# Patient Record
Sex: Female | Born: 1950
Health system: Southern US, Community
[De-identification: ages and names within clinical notes are randomized; demographics above are authoritative.]

---

## 1999-09-23 ENCOUNTER — Other Ambulatory Visit: Admission: RE | Admit: 1999-09-23 | Discharge: 1999-09-23 | Payer: Self-pay | Admitting: Gynecology

## 2000-01-06 ENCOUNTER — Encounter: Admission: RE | Admit: 2000-01-06 | Discharge: 2000-01-06 | Payer: Self-pay | Admitting: Gynecology

## 2000-01-06 ENCOUNTER — Encounter: Payer: Self-pay | Admitting: Gynecology

## 2000-09-17 ENCOUNTER — Other Ambulatory Visit: Admission: RE | Admit: 2000-09-17 | Discharge: 2000-09-17 | Payer: Self-pay | Admitting: Gynecology

## 2001-01-07 ENCOUNTER — Encounter: Admission: RE | Admit: 2001-01-07 | Discharge: 2001-01-07 | Payer: Self-pay

## 2001-01-07 ENCOUNTER — Encounter: Payer: Self-pay | Admitting: Gynecology

## 2001-09-27 ENCOUNTER — Encounter: Admission: RE | Admit: 2001-09-27 | Discharge: 2001-09-27 | Payer: Self-pay | Admitting: Gynecology

## 2001-09-27 ENCOUNTER — Encounter: Payer: Self-pay | Admitting: Gynecology

## 2001-09-30 ENCOUNTER — Other Ambulatory Visit: Admission: RE | Admit: 2001-09-30 | Discharge: 2001-09-30 | Payer: Self-pay | Admitting: Gynecology

## 2002-10-24 ENCOUNTER — Other Ambulatory Visit: Admission: RE | Admit: 2002-10-24 | Discharge: 2002-10-24 | Payer: Self-pay | Admitting: Gynecology

## 2003-11-09 ENCOUNTER — Encounter: Admission: RE | Admit: 2003-11-09 | Discharge: 2003-11-09 | Payer: Self-pay | Admitting: Gynecology

## 2003-11-09 ENCOUNTER — Other Ambulatory Visit: Admission: RE | Admit: 2003-11-09 | Discharge: 2003-11-09 | Payer: Self-pay | Admitting: Gynecology

## 2005-01-13 ENCOUNTER — Other Ambulatory Visit: Admission: RE | Admit: 2005-01-13 | Discharge: 2005-01-13 | Payer: Self-pay | Admitting: Gynecology

## 2005-11-10 ENCOUNTER — Encounter: Admission: RE | Admit: 2005-11-10 | Discharge: 2005-11-10 | Payer: Self-pay | Admitting: Gynecology

## 2005-11-22 ENCOUNTER — Encounter: Admission: RE | Admit: 2005-11-22 | Discharge: 2005-11-22 | Payer: Self-pay | Admitting: Gynecology

## 2005-11-24 ENCOUNTER — Encounter (INDEPENDENT_AMBULATORY_CARE_PROVIDER_SITE_OTHER): Payer: Self-pay | Admitting: Diagnostic Radiology

## 2005-11-24 ENCOUNTER — Encounter: Admission: RE | Admit: 2005-11-24 | Discharge: 2005-11-24 | Payer: Self-pay | Admitting: Gynecology

## 2005-11-24 ENCOUNTER — Encounter (INDEPENDENT_AMBULATORY_CARE_PROVIDER_SITE_OTHER): Payer: Self-pay | Admitting: Specialist

## 2005-11-24 ENCOUNTER — Encounter (INDEPENDENT_AMBULATORY_CARE_PROVIDER_SITE_OTHER): Payer: Self-pay | Admitting: *Deleted

## 2005-11-27 ENCOUNTER — Encounter: Admission: RE | Admit: 2005-11-27 | Discharge: 2005-11-27 | Payer: Self-pay | Admitting: Gynecology

## 2005-12-08 ENCOUNTER — Encounter: Admission: RE | Admit: 2005-12-08 | Discharge: 2005-12-08 | Payer: Self-pay | Admitting: Gynecology

## 2006-01-08 ENCOUNTER — Ambulatory Visit (HOSPITAL_BASED_OUTPATIENT_CLINIC_OR_DEPARTMENT_OTHER): Admission: RE | Admit: 2006-01-08 | Discharge: 2006-01-08 | Payer: Self-pay | Admitting: General Surgery

## 2006-01-08 ENCOUNTER — Encounter: Admission: RE | Admit: 2006-01-08 | Discharge: 2006-01-08 | Payer: Self-pay | Admitting: General Surgery

## 2006-01-08 ENCOUNTER — Encounter (INDEPENDENT_AMBULATORY_CARE_PROVIDER_SITE_OTHER): Payer: Self-pay | Admitting: *Deleted

## 2006-01-08 ENCOUNTER — Encounter (INDEPENDENT_AMBULATORY_CARE_PROVIDER_SITE_OTHER): Payer: Self-pay | Admitting: General Surgery

## 2006-01-22 ENCOUNTER — Ambulatory Visit: Payer: Self-pay | Admitting: Oncology

## 2006-01-31 LAB — COMPREHENSIVE METABOLIC PANEL
AST: 35 U/L (ref 0–37)
Albumin: 4.2 g/dL (ref 3.5–5.2)
Alkaline Phosphatase: 256 U/L — ABNORMAL HIGH (ref 39–117)
BUN: 12 mg/dL (ref 6–23)
Creatinine, Ser: 0.86 mg/dL (ref 0.40–1.20)
Glucose, Bld: 95 mg/dL (ref 70–99)
Potassium: 4.1 mEq/L (ref 3.5–5.3)
Total Bilirubin: 0.6 mg/dL (ref 0.3–1.2)

## 2006-01-31 LAB — CBC WITH DIFFERENTIAL/PLATELET
EOS%: 4.3 % (ref 0.0–7.0)
HCT: 33.9 % — ABNORMAL LOW (ref 34.8–46.6)
HGB: 11.3 g/dL — ABNORMAL LOW (ref 11.6–15.9)
MCH: 32.2 pg (ref 26.0–34.0)
MCV: 96.6 fL (ref 81.0–101.0)
MONO#: 0.5 10*3/uL (ref 0.1–0.9)
NEUT%: 56.9 % (ref 39.6–76.8)
RBC: 3.51 10*6/uL — ABNORMAL LOW (ref 3.70–5.32)
RDW: 13.4 % (ref 11.3–14.5)
lymph#: 1.4 10*3/uL (ref 0.9–3.3)

## 2006-01-31 LAB — LACTATE DEHYDROGENASE: LDH: 355 U/L — ABNORMAL HIGH (ref 94–250)

## 2006-02-09 ENCOUNTER — Other Ambulatory Visit: Admission: RE | Admit: 2006-02-09 | Discharge: 2006-02-09 | Payer: Self-pay | Admitting: Gynecology

## 2006-02-09 ENCOUNTER — Encounter: Admission: RE | Admit: 2006-02-09 | Discharge: 2006-02-09 | Payer: Self-pay | Admitting: General Surgery

## 2006-02-12 ENCOUNTER — Ambulatory Visit (HOSPITAL_BASED_OUTPATIENT_CLINIC_OR_DEPARTMENT_OTHER): Admission: RE | Admit: 2006-02-12 | Discharge: 2006-02-12 | Payer: Self-pay | Admitting: General Surgery

## 2006-02-12 ENCOUNTER — Encounter (INDEPENDENT_AMBULATORY_CARE_PROVIDER_SITE_OTHER): Payer: Self-pay | Admitting: *Deleted

## 2006-02-19 ENCOUNTER — Ambulatory Visit: Admission: RE | Admit: 2006-02-19 | Discharge: 2006-05-06 | Payer: Self-pay | Admitting: *Deleted

## 2006-02-20 LAB — CBC WITH DIFFERENTIAL/PLATELET
BASO%: 0.8 % (ref 0.0–2.0)
Eosinophils Absolute: 0.2 10*3/uL (ref 0.0–0.5)
HCT: 35.9 % (ref 34.8–46.6)
HGB: 12.1 g/dL (ref 11.6–15.9)
LYMPH%: 32.3 % (ref 14.0–48.0)
MCHC: 33.9 g/dL (ref 32.0–36.0)
MONO#: 0.6 10*3/uL (ref 0.1–0.9)
NEUT#: 2.7 10*3/uL (ref 1.5–6.5)
NEUT%: 51.4 % (ref 39.6–76.8)
Platelets: 207 10*3/uL (ref 145–400)
WBC: 5.3 10*3/uL (ref 3.9–10.0)
lymph#: 1.7 10*3/uL (ref 0.9–3.3)

## 2006-02-20 LAB — COMPREHENSIVE METABOLIC PANEL
ALT: 25 U/L (ref 0–40)
CO2: 30 mEq/L (ref 19–32)
Calcium: 9.6 mg/dL (ref 8.4–10.5)
Chloride: 102 mEq/L (ref 96–112)
Creatinine, Ser: 0.8 mg/dL (ref 0.40–1.20)
Glucose, Bld: 82 mg/dL (ref 70–99)
Total Protein: 7.6 g/dL (ref 6.0–8.3)

## 2006-02-20 LAB — CANCER ANTIGEN 27.29: CA 27.29: 43 U/mL — ABNORMAL HIGH (ref 0–39)

## 2006-02-20 LAB — LACTATE DEHYDROGENASE: LDH: 177 U/L (ref 94–250)

## 2006-05-14 ENCOUNTER — Ambulatory Visit: Payer: Self-pay | Admitting: Oncology

## 2006-05-25 LAB — CBC WITH DIFFERENTIAL/PLATELET
BASO%: 0.3 % (ref 0.0–2.0)
EOS%: 3.4 % (ref 0.0–7.0)
HCT: 36.8 % (ref 34.8–46.6)
HGB: 12.5 g/dL (ref 11.6–15.9)
MCH: 31.5 pg (ref 26.0–34.0)
MONO#: 0.5 10*3/uL (ref 0.1–0.9)
Platelets: 167 10*3/uL (ref 145–400)
RBC: 3.99 10*6/uL (ref 3.70–5.32)
WBC: 2.8 10*3/uL — ABNORMAL LOW (ref 3.9–10.0)

## 2006-05-25 LAB — COMPREHENSIVE METABOLIC PANEL
ALT: 51 U/L — ABNORMAL HIGH (ref 0–35)
AST: 47 U/L — ABNORMAL HIGH (ref 0–37)
Alkaline Phosphatase: 124 U/L — ABNORMAL HIGH (ref 39–117)
Calcium: 9.3 mg/dL (ref 8.4–10.5)
Chloride: 103 mEq/L (ref 96–112)
Creatinine, Ser: 0.77 mg/dL (ref 0.40–1.20)
Potassium: 4 mEq/L (ref 3.5–5.3)
Total Bilirubin: 0.7 mg/dL (ref 0.3–1.2)
Total Protein: 7.1 g/dL (ref 6.0–8.3)

## 2006-05-25 LAB — CANCER ANTIGEN 27.29: CA 27.29: 36 U/mL (ref 0–39)

## 2006-06-26 ENCOUNTER — Ambulatory Visit: Payer: Self-pay | Admitting: Oncology

## 2006-10-04 ENCOUNTER — Ambulatory Visit: Payer: Self-pay | Admitting: Oncology

## 2006-10-04 LAB — CBC WITH DIFFERENTIAL/PLATELET
BASO%: 0.3 % (ref 0.0–2.0)
Basophils Absolute: 0 10*3/uL (ref 0.0–0.1)
EOS%: 3 % (ref 0.0–7.0)
HGB: 12.5 g/dL (ref 11.6–15.9)
MCH: 32.6 pg (ref 26.0–34.0)
RBC: 3.83 10*6/uL (ref 3.70–5.32)
RDW: 12.9 % (ref 11.3–14.5)
lymph#: 1.4 10*3/uL (ref 0.9–3.3)

## 2006-10-05 LAB — COMPREHENSIVE METABOLIC PANEL
ALT: 20 U/L (ref 0–35)
AST: 23 U/L (ref 0–37)
Albumin: 4.2 g/dL (ref 3.5–5.2)
Alkaline Phosphatase: 71 U/L (ref 39–117)
Glucose, Bld: 113 mg/dL — ABNORMAL HIGH (ref 70–99)
Potassium: 3.9 mEq/L (ref 3.5–5.3)
Sodium: 139 mEq/L (ref 135–145)
Total Protein: 6.9 g/dL (ref 6.0–8.3)

## 2006-10-05 LAB — CANCER ANTIGEN 27.29: CA 27.29: 29 U/mL (ref 0–39)

## 2006-12-18 ENCOUNTER — Ambulatory Visit: Payer: Self-pay | Admitting: Oncology

## 2006-12-21 ENCOUNTER — Encounter: Admission: RE | Admit: 2006-12-21 | Discharge: 2006-12-21 | Payer: Self-pay | Admitting: General Surgery

## 2006-12-21 LAB — CBC WITH DIFFERENTIAL/PLATELET
Basophils Absolute: 0 10*3/uL (ref 0.0–0.1)
Eosinophils Absolute: 0.3 10*3/uL (ref 0.0–0.5)
HGB: 12.7 g/dL (ref 11.6–15.9)
LYMPH%: 29.5 % (ref 14.0–48.0)
MCV: 95.1 fL (ref 81.0–101.0)
MONO#: 0.3 10*3/uL (ref 0.1–0.9)
MONO%: 7.2 % (ref 0.0–13.0)
NEUT#: 2.7 10*3/uL (ref 1.5–6.5)
Platelets: 157 10*3/uL (ref 145–400)

## 2006-12-21 LAB — COMPREHENSIVE METABOLIC PANEL
ALT: 23 U/L (ref 0–35)
Alkaline Phosphatase: 73 U/L (ref 39–117)
BUN: 12 mg/dL (ref 6–23)
CO2: 27 mEq/L (ref 19–32)
Calcium: 9.7 mg/dL (ref 8.4–10.5)
Glucose, Bld: 106 mg/dL — ABNORMAL HIGH (ref 70–99)
Potassium: 3.9 mEq/L (ref 3.5–5.3)
Total Bilirubin: 0.6 mg/dL (ref 0.3–1.2)
Total Protein: 7.1 g/dL (ref 6.0–8.3)

## 2007-06-18 ENCOUNTER — Ambulatory Visit: Payer: Self-pay | Admitting: Oncology

## 2007-06-20 LAB — CBC WITH DIFFERENTIAL/PLATELET
Basophils Absolute: 0 10*3/uL (ref 0.0–0.1)
EOS%: 4.2 % (ref 0.0–7.0)
HCT: 36.1 % (ref 34.8–46.6)
HGB: 12.4 g/dL (ref 11.6–15.9)
MCH: 32.3 pg (ref 26.0–34.0)
MCV: 94.1 fL (ref 81.0–101.0)
MONO%: 10.5 % (ref 0.0–13.0)
NEUT%: 40 % (ref 39.6–76.8)

## 2007-06-20 LAB — COMPREHENSIVE METABOLIC PANEL
AST: 22 U/L (ref 0–37)
Alkaline Phosphatase: 80 U/L (ref 39–117)
BUN: 12 mg/dL (ref 6–23)
Creatinine, Ser: 0.86 mg/dL (ref 0.40–1.20)
Total Bilirubin: 0.4 mg/dL (ref 0.3–1.2)

## 2007-06-20 LAB — CANCER ANTIGEN 27.29: CA 27.29: 35 U/mL (ref 0–39)

## 2007-07-19 ENCOUNTER — Encounter (INDEPENDENT_AMBULATORY_CARE_PROVIDER_SITE_OTHER): Payer: Self-pay | Admitting: Gynecology

## 2007-07-19 ENCOUNTER — Ambulatory Visit (HOSPITAL_BASED_OUTPATIENT_CLINIC_OR_DEPARTMENT_OTHER): Admission: RE | Admit: 2007-07-19 | Discharge: 2007-07-19 | Payer: Self-pay | Admitting: Gynecology

## 2008-01-03 ENCOUNTER — Encounter: Admission: RE | Admit: 2008-01-03 | Discharge: 2008-01-03 | Payer: Self-pay | Admitting: Gynecology

## 2008-04-09 ENCOUNTER — Ambulatory Visit: Payer: Self-pay | Admitting: Oncology

## 2009-02-05 ENCOUNTER — Encounter: Admission: RE | Admit: 2009-02-05 | Discharge: 2009-02-05 | Payer: Self-pay | Admitting: Gynecology

## 2010-02-11 ENCOUNTER — Encounter: Admission: RE | Admit: 2010-02-11 | Discharge: 2010-02-11 | Payer: Self-pay | Admitting: Gynecology

## 2010-06-19 ENCOUNTER — Encounter: Payer: Self-pay | Admitting: Gynecology

## 2010-10-11 NOTE — Op Note (Signed)
Heidi Barnes, Heidi Barnes               ACCOUNT NO.:  [id number]   MEDICAL RECORD NO.:  [id number]          PATIENT TYPE:  AMB   LOCATION:  [facility]                         FACILITY:  [facility]   PHYSICIAN:  Gretta Cool, M.D. DATE OF BIRTH:  04/21/1951   DATE OF PROCEDURE:  07/19/2007  DATE OF DISCHARGE:                               OPERATIVE REPORT   PREOPERATIVE DIAGNOSIS:  Large endometrial polyp on Tamoxifen therapy.   POSTOPERATIVE DIAGNOSIS:  Large endometrial polyp on Tamoxifen therapy.   PROCEDURE PERFORMED:  Hysteroscopy, resection of large endometrial  polyp, total endometrial ablation by resection plus VaporTrode.   SURGEON:  Gretta Cool, M.D.   ANESTHESIA:  IV sedation, paracervical block.   DESCRIPTION OF PROCEDURE:  Under excellent IV anesthesia as above, with  the patient prepped and draped in the lithotomy position in [place]  stirrups, the cervix was grasped with a single tooth tenaculum and  pulled down into view.  After paracervical, the cervix was progressively  dilated with a series of Pratt dilators to accommodate the 7 mm  resectoscope.  The resectoscope was used to photograph the cavity and  then to resect the large polyp identified.  The polyp was attached to  the posterior wall and was progressively resected.  The entire  endometrial cavity was also resected extending 5 mm or more out into the  myometrium until no further planned openings could be identified.  At  this point, the entire cavity was treated with VaporTrode so as to  eliminate any islands of endometrial tissue viable in the superficial  myometrium.  At this point, with reduced pressure, there was no  significant bleeding.  Fluid deficit 80 mL.  No complications.           ______________________________  Gretta Cool, M.D.     CWL/MEDQ  D:  07/19/2007  T:  07/19/2007  Job:  [id number]   cc:   Valentino Hue. Magrinat, M.D.  Fax: [phone]   Dr. Belva Crome

## 2010-10-14 ENCOUNTER — Other Ambulatory Visit: Payer: Self-pay | Admitting: Gynecology

## 2010-10-14 NOTE — Op Note (Signed)
Heidi Barnes, Heidi Barnes               ACCOUNT NO.:  [id number]   MEDICAL RECORD NO.:  [id number]          PATIENT TYPE:  AMB   LOCATION:  [facility]                          FACILITY:  [facility]   PHYSICIAN:  Rose Phi. Maple Hudson, M.D.   DATE OF BIRTH:  08/12/1950   DATE OF PROCEDURE:  01/08/2006  DATE OF DISCHARGE:                                 OPERATIVE REPORT   PREOPERATIVE DIAGNOSES:  1. Ductal carcinoma in situ of the right breast.  2. Atypical lobular hyperplasia of the left breast.   POSTOPERATIVE DIAGNOSES:  1. Ductal carcinoma in situ of the right breast.  2. Atypical lobular hyperplasia of the left breast.   OPERATION:  1. Right partial mastectomy with needle localization and specimen      mammogram.  2. Left breast biopsy with needle localization and specimen mammogram.   SURGEON:  Rose Phi. Maple Hudson, M.D.   ANESTHESIA:  General.   OPERATIVE PROCEDURE:  Prior to coming to the operating room two localizing  wires had been placed in the area of microcalcifications of the right breast  and a single wire in the left breast where the [facility] had been identified.   After suitable general anesthesia was induced, the patient was placed in the  supine position with arms extended on the arm board and both breasts prepped  and draped in the usual fashion.  The two wires were basically in the upper  outer quadrant of the right breast and I outlined a transverse incision.  The incision was made.  The wires exposed and then a wide excision of wire  and surrounding tissue was carried out.  Hemostasis obtained with cautery.  Specimen oriented for the pathologist.  Specimen mammogram confirmed the  removal of what was thought to be all the microcalcifications.  That  incision was closed, after infiltrating it with 0.25% Marcaine, with a  single layer of subcuticular 4-0 Monocryl and Steri-Strips.   While that was being done, a similar incision was made on the left side,  also in the upper outer quadrant,  and the wire and surrounding tissue were  excised.  Hemostasis obtained with cautery.  Specimen mammogram confirmed  the removal of the clip from the previous biopsy.  Single layer of the  subcuticular 4-0 Monocryl suture was used to close the skin and Steri-Strips  were applied.   Dressings were placed and then the patient was wrapped with an Ace bandage  and then allowed to go home.      Rose Phi. Maple Hudson, M.D.  Electronically Signed     PRY/MEDQ  D:  01/08/2006  T:  01/09/2006  Job:  [id number]

## 2010-10-14 NOTE — Op Note (Signed)
NAMESACHE, SANE               ACCOUNT NO.:  [id number]   MEDICAL RECORD NO.:  [id number]          PATIENT TYPE:  AMB   LOCATION:  [facility]                          FACILITY:  [facility]   PHYSICIAN:  Rose Phi. Maple Hudson, M.D.   DATE OF BIRTH:  [birth year]   DATE OF PROCEDURE:  02/12/2006  DATE OF DISCHARGE:                                 OPERATIVE REPORT   PREOPERATIVE DIAGNOSIS:  Stage I carcinoma of the right breast.   POSTOPERATIVE DIAGNOSIS:  Stage I carcinoma of the right breast.   OPERATIONS:  1. Blue dye injection.  2. Right sentinel lymph node biopsy.  3. Evacuation of breast hematoma.   SURGEON:  Dr. Francina Ames.   ANESTHESIA:  General.   OPERATIVE PROCEDURE:  This patient had originally presented with ductal  carcinoma in situ of the right breast which was excised.  A small, less than  5-mm focus of invasive cancer was discovered in this area of DCIS and so we  are returning to do a sentinel node biopsy.  In addition, she developed a  hematoma of the breast which needs to be evacuated.  Prior to coming to the  operating room, 1 millicurie of technetium sulfur colloid was injected  intradermally in the right breast.   After suitable general anesthesia was induced, the patient was placed in the  supine position with the arms extended on the arm board.  Then 5 cc of a  mixture of 2 cc of methylene blue and 3 cc of injectable saline was injected  in the subareolar tissue and the breast gently massaged for 3 minutes.  We  then prepped and draped the breast and axilla.  Careful scanning of the  right axilla with the NeoProbe revealed a hot spot.  A short transverse  axillary incision was made with dissection through the subcutaneous tissue.  The clavipectoral fascia was divided and deep to it was a blue and hot lymph  node.  This was removed as a sentinel node and actually it was 2 lymph  nodes.  There were no other blue, hot or palpable areas.   While that was being looked at by  the pathologist, I injected the incision  with a 0.25% Marcaine and then closed it with 3-0 Vicryl and subcuticular 4-  0 Monocryl and Steri-Strips.   The previous lumpectomy had been done in the upper portion of the breast.  The lateral margin of incision I incised and entered the hematoma cavity and  evacuated just a moderate-sized hematoma.  Because of the distortion, there  was a lot of blood dissected through the tissues which, of course, would not  come out, but I did evacuate the hematoma and then flushed out, with saline,  the cavity.  I then closed that with a subcuticular Monocryl suture and  Steri-Strips.   The sentinel node was reported as negative for metastatic disease.   Dressings were applied, and the patient transferred to the recovery room in  stable condition having tolerated the procedure well.      Rose Phi. Maple Hudson, M.D.  Electronically Signed  PRY/MEDQ  D:  02/12/2006  T:  02/12/2006  Job:  [id number]

## 2011-01-10 ENCOUNTER — Other Ambulatory Visit: Payer: Self-pay | Admitting: Gynecology

## 2011-01-10 DIAGNOSIS — Z853 Personal history of malignant neoplasm of breast: Secondary | ICD-10-CM

## 2011-01-10 DIAGNOSIS — Z9889 Other specified postprocedural states: Secondary | ICD-10-CM

## 2011-02-17 LAB — POCT I-STAT 4, (NA,K, GLUC, HGB,HCT)
HCT: 42
Operator id: 268271

## 2011-02-24 ENCOUNTER — Ambulatory Visit
Admission: RE | Admit: 2011-02-24 | Discharge: 2011-02-24 | Disposition: A | Discharge status: Home / Self Care | Payer: BC Managed Care – PPO | Source: Ambulatory Visit | Attending: Gynecology | Admitting: Gynecology

## 2011-02-24 DIAGNOSIS — Z853 Personal history of malignant neoplasm of breast: Secondary | ICD-10-CM

## 2011-02-24 DIAGNOSIS — Z9889 Other specified postprocedural states: Secondary | ICD-10-CM

## 2012-02-02 ENCOUNTER — Other Ambulatory Visit: Payer: Self-pay | Admitting: Gynecology

## 2012-03-11 ENCOUNTER — Other Ambulatory Visit: Payer: Self-pay | Admitting: Gynecology

## 2012-03-11 DIAGNOSIS — Z9882 Breast implant status: Secondary | ICD-10-CM

## 2012-03-11 DIAGNOSIS — Z853 Personal history of malignant neoplasm of breast: Secondary | ICD-10-CM

## 2012-03-22 ENCOUNTER — Ambulatory Visit
Admission: RE | Admit: 2012-03-22 | Discharge: 2012-03-22 | Disposition: A | Discharge status: Home / Self Care | Payer: BC Managed Care – PPO | Source: Ambulatory Visit | Attending: Gynecology | Admitting: Gynecology

## 2012-03-22 DIAGNOSIS — Z9882 Breast implant status: Secondary | ICD-10-CM

## 2012-03-22 DIAGNOSIS — Z853 Personal history of malignant neoplasm of breast: Secondary | ICD-10-CM

## 2013-03-17 ENCOUNTER — Other Ambulatory Visit: Payer: Self-pay | Admitting: Family Medicine

## 2013-03-17 DIAGNOSIS — Z9882 Breast implant status: Secondary | ICD-10-CM

## 2013-03-17 DIAGNOSIS — Z853 Personal history of malignant neoplasm of breast: Secondary | ICD-10-CM

## 2013-03-28 ENCOUNTER — Ambulatory Visit
Admission: RE | Admit: 2013-03-28 | Discharge: 2013-03-28 | Disposition: A | Discharge status: Home / Self Care | Payer: BC Managed Care – PPO | Source: Ambulatory Visit | Attending: Family Medicine | Admitting: Family Medicine

## 2013-03-28 DIAGNOSIS — Z853 Personal history of malignant neoplasm of breast: Secondary | ICD-10-CM

## 2013-03-28 DIAGNOSIS — Z9882 Breast implant status: Secondary | ICD-10-CM

## 2014-03-19 ENCOUNTER — Other Ambulatory Visit: Payer: Self-pay

## 2014-03-19 DIAGNOSIS — Z1231 Encounter for screening mammogram for malignant neoplasm of breast: Secondary | ICD-10-CM

## 2014-04-03 ENCOUNTER — Ambulatory Visit
Admission: RE | Admit: 2014-04-03 | Discharge: 2014-04-03 | Disposition: A | Discharge status: Home / Self Care | Payer: Medicare Other | Source: Ambulatory Visit

## 2014-04-03 ENCOUNTER — Other Ambulatory Visit: Payer: Self-pay

## 2014-04-03 DIAGNOSIS — Z1231 Encounter for screening mammogram for malignant neoplasm of breast: Secondary | ICD-10-CM

## 2014-10-23 DIAGNOSIS — Z79899 Other long term (current) drug therapy: Secondary | ICD-10-CM | POA: Diagnosis not present

## 2014-10-23 DIAGNOSIS — Z0000 Encounter for general adult medical examination without abnormal findings: Secondary | ICD-10-CM | POA: Diagnosis not present

## 2014-10-23 DIAGNOSIS — I10 Essential (primary) hypertension: Secondary | ICD-10-CM | POA: Diagnosis not present

## 2014-10-23 DIAGNOSIS — Z01411 Encounter for gynecological examination (general) (routine) with abnormal findings: Secondary | ICD-10-CM | POA: Diagnosis not present

## 2014-10-23 DIAGNOSIS — Z79891 Long term (current) use of opiate analgesic: Secondary | ICD-10-CM | POA: Diagnosis not present

## 2014-10-23 DIAGNOSIS — M818 Other osteoporosis without current pathological fracture: Secondary | ICD-10-CM | POA: Diagnosis not present

## 2014-10-23 DIAGNOSIS — Z01419 Encounter for gynecological examination (general) (routine) without abnormal findings: Secondary | ICD-10-CM | POA: Diagnosis not present

## 2014-10-23 DIAGNOSIS — E039 Hypothyroidism, unspecified: Secondary | ICD-10-CM | POA: Diagnosis not present

## 2014-10-23 DIAGNOSIS — Z23 Encounter for immunization: Secondary | ICD-10-CM | POA: Diagnosis not present

## 2014-10-28 DIAGNOSIS — M818 Other osteoporosis without current pathological fracture: Secondary | ICD-10-CM | POA: Diagnosis not present

## 2014-10-28 DIAGNOSIS — M810 Age-related osteoporosis without current pathological fracture: Secondary | ICD-10-CM | POA: Diagnosis not present

## 2015-04-07 ENCOUNTER — Other Ambulatory Visit: Payer: Self-pay

## 2015-04-07 DIAGNOSIS — Z1231 Encounter for screening mammogram for malignant neoplasm of breast: Secondary | ICD-10-CM

## 2015-04-13 DIAGNOSIS — Z23 Encounter for immunization: Secondary | ICD-10-CM | POA: Diagnosis not present

## 2015-05-14 ENCOUNTER — Ambulatory Visit
Admission: RE | Admit: 2015-05-14 | Discharge: 2015-05-14 | Disposition: A | Discharge status: Home / Self Care | Payer: Medicare Other | Source: Ambulatory Visit

## 2015-05-14 DIAGNOSIS — Z1231 Encounter for screening mammogram for malignant neoplasm of breast: Secondary | ICD-10-CM

## 2015-06-07 DIAGNOSIS — I10 Essential (primary) hypertension: Secondary | ICD-10-CM | POA: Diagnosis not present

## 2015-06-07 DIAGNOSIS — Z79899 Other long term (current) drug therapy: Secondary | ICD-10-CM | POA: Diagnosis not present

## 2015-06-07 DIAGNOSIS — E039 Hypothyroidism, unspecified: Secondary | ICD-10-CM | POA: Diagnosis not present

## 2015-06-07 DIAGNOSIS — M818 Other osteoporosis without current pathological fracture: Secondary | ICD-10-CM | POA: Diagnosis not present

## 2015-06-24 DIAGNOSIS — D351 Benign neoplasm of parathyroid gland: Secondary | ICD-10-CM | POA: Diagnosis not present

## 2015-06-24 DIAGNOSIS — R947 Abnormal results of other endocrine function studies: Secondary | ICD-10-CM | POA: Diagnosis not present

## 2015-12-06 DIAGNOSIS — I10 Essential (primary) hypertension: Secondary | ICD-10-CM | POA: Diagnosis not present

## 2015-12-06 DIAGNOSIS — E039 Hypothyroidism, unspecified: Secondary | ICD-10-CM | POA: Diagnosis not present

## 2015-12-10 ENCOUNTER — Encounter: Payer: Self-pay | Admitting: Genetic Counselor

## 2016-01-25 ENCOUNTER — Other Ambulatory Visit: Payer: Self-pay

## 2016-03-28 DIAGNOSIS — Z961 Presence of intraocular lens: Secondary | ICD-10-CM | POA: Diagnosis not present

## 2016-03-28 DIAGNOSIS — Z947 Corneal transplant status: Secondary | ICD-10-CM | POA: Diagnosis not present

## 2016-04-06 DIAGNOSIS — Z6826 Body mass index (BMI) 26.0-26.9, adult: Secondary | ICD-10-CM | POA: Diagnosis not present

## 2016-04-06 DIAGNOSIS — Z0000 Encounter for general adult medical examination without abnormal findings: Secondary | ICD-10-CM | POA: Diagnosis not present

## 2016-04-06 DIAGNOSIS — M818 Other osteoporosis without current pathological fracture: Secondary | ICD-10-CM | POA: Diagnosis not present

## 2016-04-06 DIAGNOSIS — E039 Hypothyroidism, unspecified: Secondary | ICD-10-CM | POA: Diagnosis not present

## 2016-04-06 DIAGNOSIS — Z23 Encounter for immunization: Secondary | ICD-10-CM | POA: Diagnosis not present

## 2016-04-06 DIAGNOSIS — I10 Essential (primary) hypertension: Secondary | ICD-10-CM | POA: Diagnosis not present

## 2016-04-06 DIAGNOSIS — Z79899 Other long term (current) drug therapy: Secondary | ICD-10-CM | POA: Diagnosis not present

## 2016-05-10 IMAGING — MG MM SCREENING BREAST W/IMPLANT TOMO BILATERAL
9 of 13 series · 9 of 29 positions shown · non-contrast
Comparison: Previous exam(s).

CLINICAL DATA: Screening.

EXAM:
DIGITAL SCREENING BILATERAL MAMMOGRAM WITH IMPLANTS, 3D TOMO WITH
CAD

[L CC (1 of 2)]
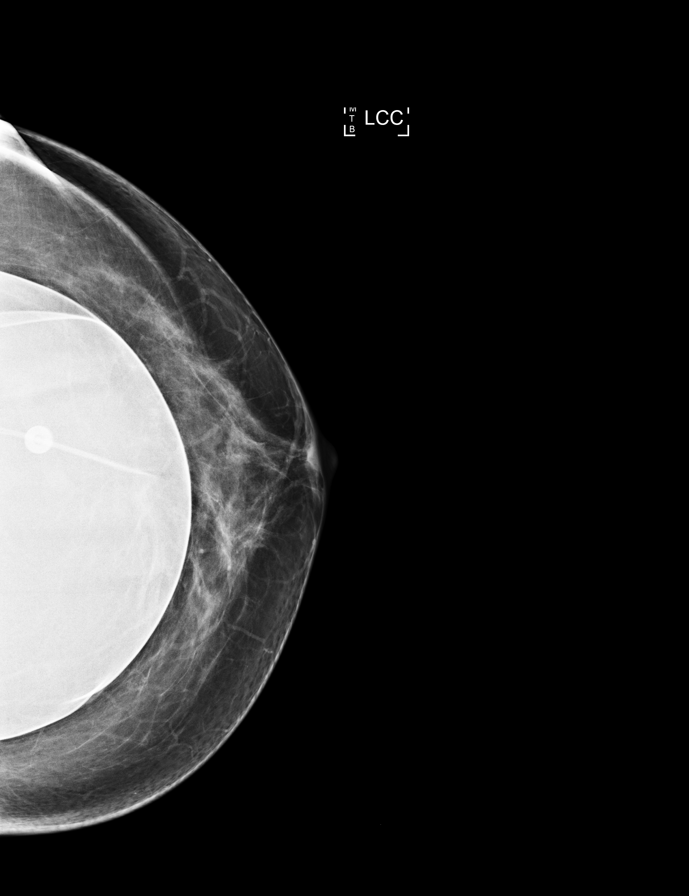

[R MLO (1 of 3)]
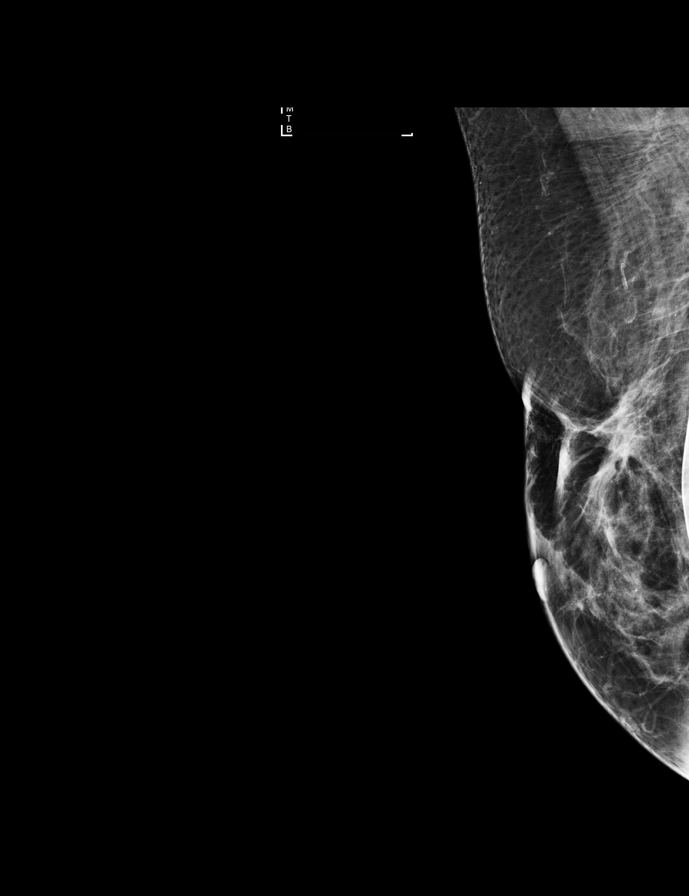

[R MLO (2 of 3)]
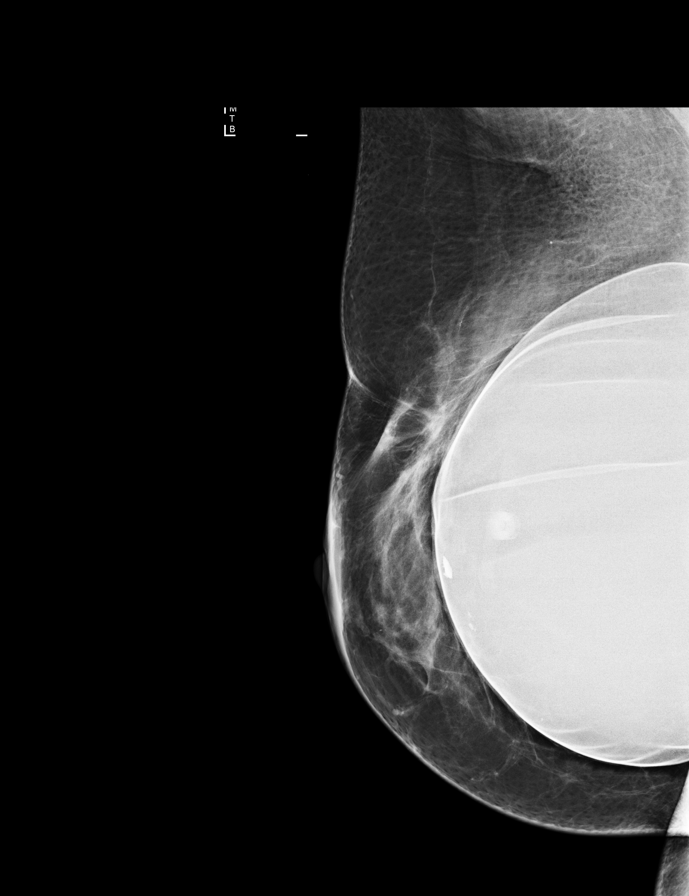

[R CC (1 of 2)]
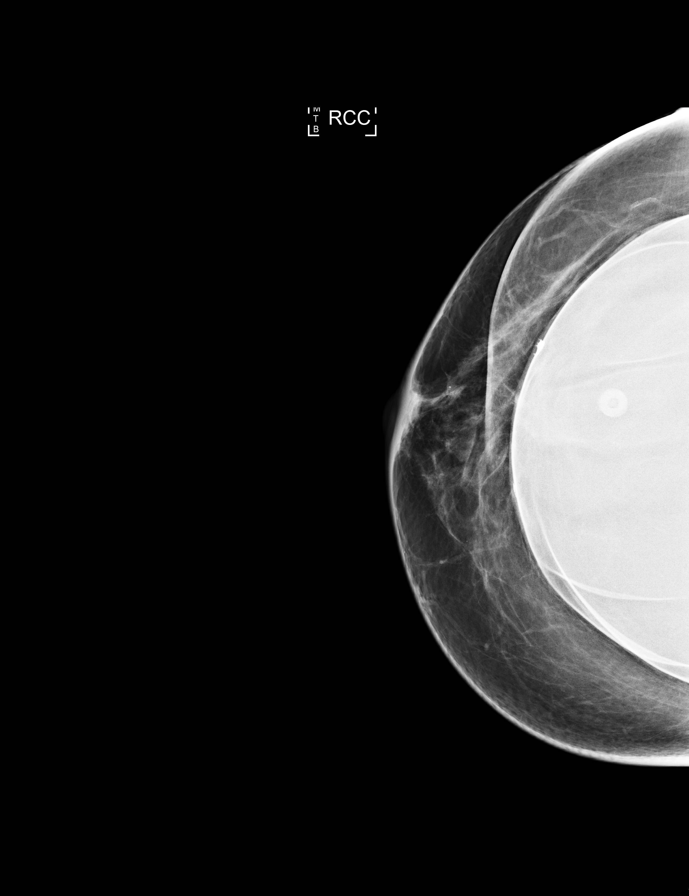

[L MLO (1 of 2)]
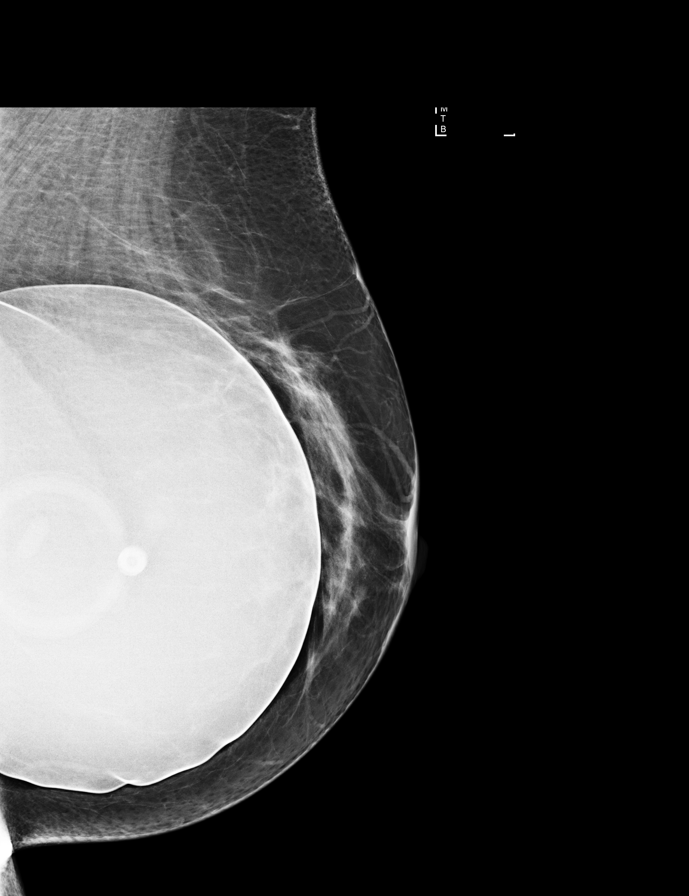

[L CC (2 of 2)]
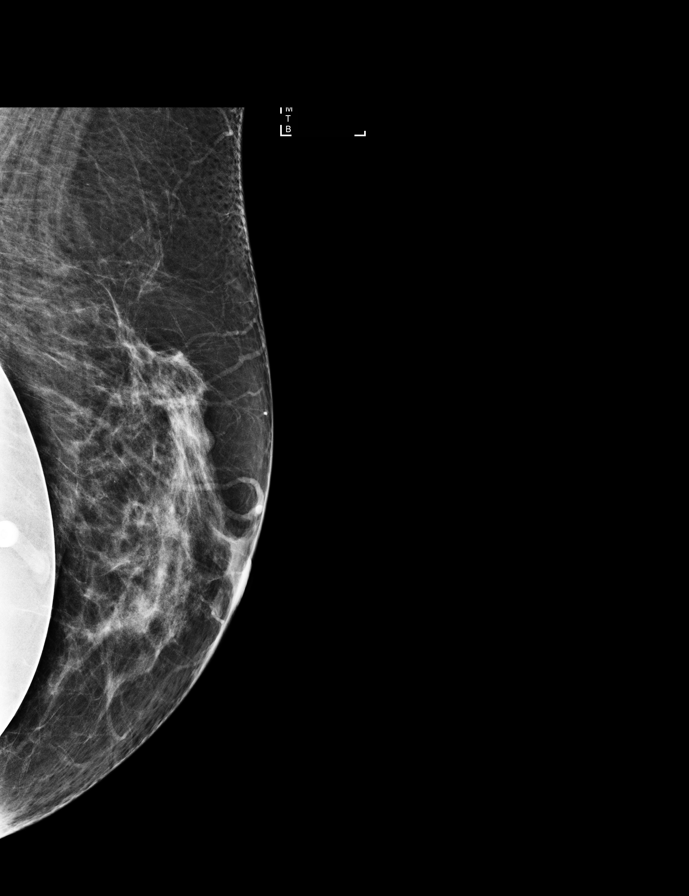

[R CC (2 of 2)]
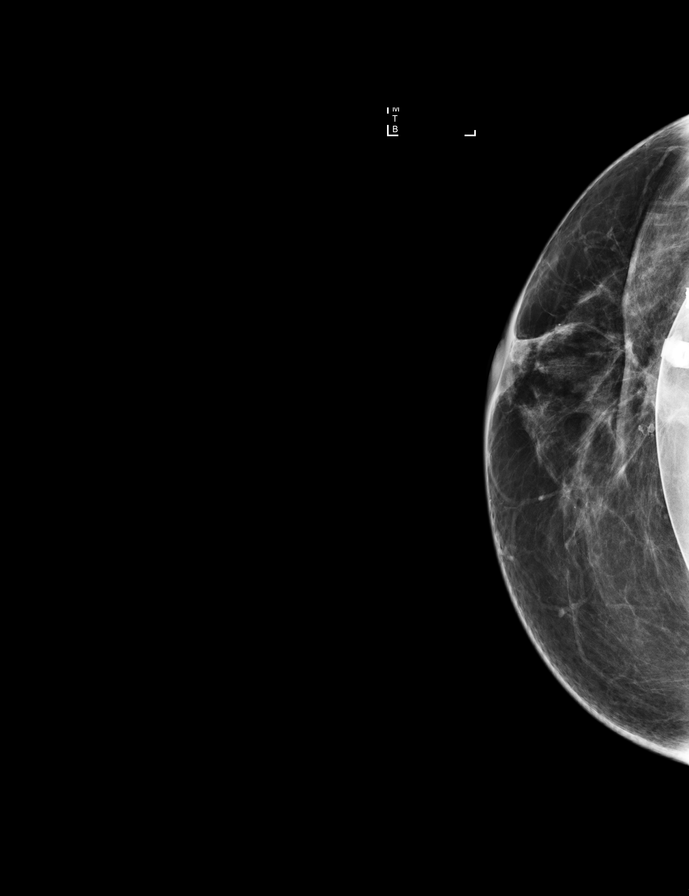

[R MLO (3 of 3)]
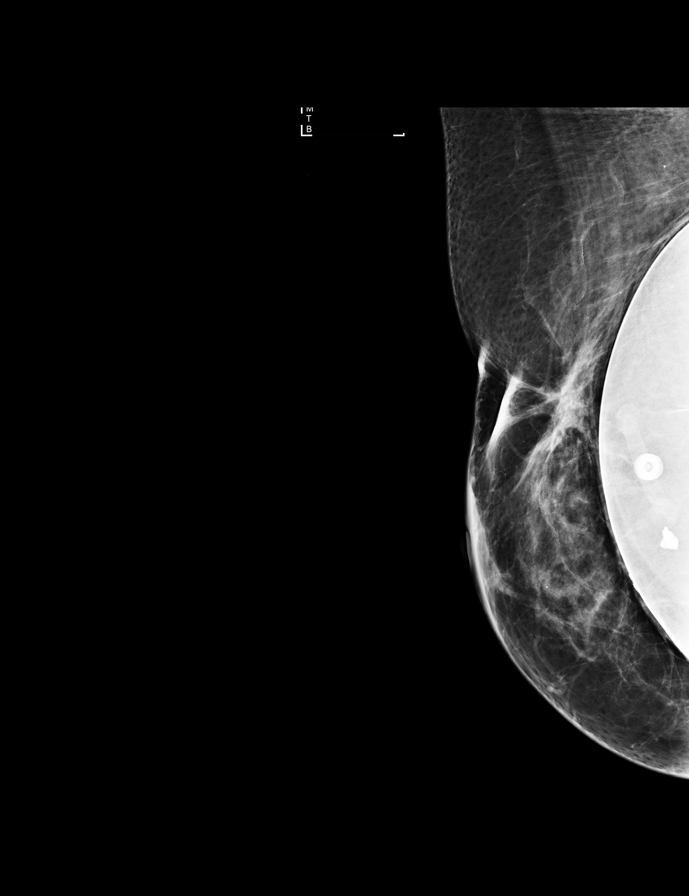

[L MLO (2 of 2)]
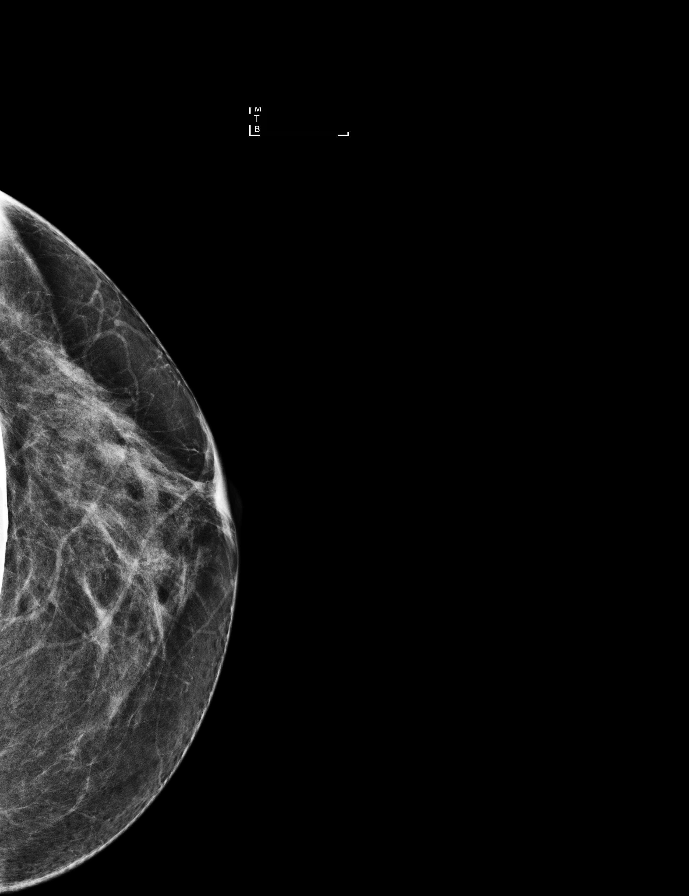

[9 of 29 positions shown; findings below may reference images not displayed]

ACR Breast Density Category c: The breast tissue is heterogeneously
dense, which may obscure small masses.
FINDINGS: There are no findings suspicious for malignancy. The Postoperative
changes are seen in the right breast. The patient has bilateral
retropectoral saline implants. Images were processed with CAD.
IMPRESSION: No mammographic evidence of malignancy. A result letter of this
screening mammogram will be mailed directly to the patient.

RECOMMENDATION:
Screening mammogram in one year. (Code:LL-K-V28)

BI-RADS CATEGORY  1: Negative.

## 2016-05-15 DIAGNOSIS — Z1231 Encounter for screening mammogram for malignant neoplasm of breast: Secondary | ICD-10-CM | POA: Diagnosis not present

## 2016-05-15 DIAGNOSIS — Z9882 Breast implant status: Secondary | ICD-10-CM | POA: Diagnosis not present

## 2016-10-05 DIAGNOSIS — I10 Essential (primary) hypertension: Secondary | ICD-10-CM | POA: Diagnosis not present

## 2016-10-05 DIAGNOSIS — E039 Hypothyroidism, unspecified: Secondary | ICD-10-CM | POA: Diagnosis not present

## 2016-10-05 DIAGNOSIS — Z79899 Other long term (current) drug therapy: Secondary | ICD-10-CM | POA: Diagnosis not present

## 2017-03-08 DIAGNOSIS — Z23 Encounter for immunization: Secondary | ICD-10-CM | POA: Diagnosis not present

## 2017-03-21 DIAGNOSIS — D224 Melanocytic nevi of scalp and neck: Secondary | ICD-10-CM | POA: Diagnosis not present

## 2017-03-21 DIAGNOSIS — L821 Other seborrheic keratosis: Secondary | ICD-10-CM | POA: Diagnosis not present

## 2017-03-21 DIAGNOSIS — D485 Neoplasm of uncertain behavior of skin: Secondary | ICD-10-CM | POA: Diagnosis not present

## 2017-04-09 DIAGNOSIS — M818 Other osteoporosis without current pathological fracture: Secondary | ICD-10-CM | POA: Diagnosis not present

## 2017-04-09 DIAGNOSIS — I10 Essential (primary) hypertension: Secondary | ICD-10-CM | POA: Diagnosis not present

## 2017-04-09 DIAGNOSIS — Z79899 Other long term (current) drug therapy: Secondary | ICD-10-CM | POA: Diagnosis not present

## 2017-04-09 DIAGNOSIS — Z0000 Encounter for general adult medical examination without abnormal findings: Secondary | ICD-10-CM | POA: Diagnosis not present

## 2017-04-09 DIAGNOSIS — E039 Hypothyroidism, unspecified: Secondary | ICD-10-CM | POA: Diagnosis not present

## 2017-04-09 DIAGNOSIS — Z6826 Body mass index (BMI) 26.0-26.9, adult: Secondary | ICD-10-CM | POA: Diagnosis not present

## 2017-06-04 DIAGNOSIS — Z961 Presence of intraocular lens: Secondary | ICD-10-CM | POA: Diagnosis not present

## 2017-06-04 DIAGNOSIS — Z947 Corneal transplant status: Secondary | ICD-10-CM | POA: Diagnosis not present

## 2017-06-15 DIAGNOSIS — Z1231 Encounter for screening mammogram for malignant neoplasm of breast: Secondary | ICD-10-CM | POA: Diagnosis not present

## 2017-08-13 DIAGNOSIS — J0100 Acute maxillary sinusitis, unspecified: Secondary | ICD-10-CM | POA: Diagnosis not present

## 2017-10-08 DIAGNOSIS — E039 Hypothyroidism, unspecified: Secondary | ICD-10-CM | POA: Diagnosis not present

## 2017-10-08 DIAGNOSIS — I10 Essential (primary) hypertension: Secondary | ICD-10-CM | POA: Diagnosis not present

## 2017-10-08 DIAGNOSIS — Z79899 Other long term (current) drug therapy: Secondary | ICD-10-CM | POA: Diagnosis not present

## 2017-10-08 DIAGNOSIS — E7800 Pure hypercholesterolemia, unspecified: Secondary | ICD-10-CM | POA: Diagnosis not present

## 2017-10-24 DIAGNOSIS — Z79899 Other long term (current) drug therapy: Secondary | ICD-10-CM | POA: Diagnosis not present

## 2017-10-24 DIAGNOSIS — I10 Essential (primary) hypertension: Secondary | ICD-10-CM | POA: Diagnosis not present

## 2017-10-24 DIAGNOSIS — E039 Hypothyroidism, unspecified: Secondary | ICD-10-CM | POA: Diagnosis not present

## 2018-02-11 DIAGNOSIS — K5900 Constipation, unspecified: Secondary | ICD-10-CM | POA: Diagnosis not present

## 2018-02-11 DIAGNOSIS — Z01818 Encounter for other preprocedural examination: Secondary | ICD-10-CM | POA: Diagnosis not present

## 2018-03-12 DIAGNOSIS — Z853 Personal history of malignant neoplasm of breast: Secondary | ICD-10-CM | POA: Diagnosis not present

## 2018-03-12 DIAGNOSIS — K644 Residual hemorrhoidal skin tags: Secondary | ICD-10-CM | POA: Diagnosis not present

## 2018-03-12 DIAGNOSIS — Z1211 Encounter for screening for malignant neoplasm of colon: Secondary | ICD-10-CM | POA: Diagnosis not present

## 2018-03-12 DIAGNOSIS — E079 Disorder of thyroid, unspecified: Secondary | ICD-10-CM | POA: Diagnosis not present

## 2018-03-12 DIAGNOSIS — I10 Essential (primary) hypertension: Secondary | ICD-10-CM | POA: Diagnosis not present

## 2018-03-12 DIAGNOSIS — Z79899 Other long term (current) drug therapy: Secondary | ICD-10-CM | POA: Diagnosis not present

## 2018-03-28 DIAGNOSIS — Z23 Encounter for immunization: Secondary | ICD-10-CM | POA: Diagnosis not present

## 2018-04-15 ENCOUNTER — Other Ambulatory Visit: Payer: Self-pay

## 2018-04-15 DIAGNOSIS — E7800 Pure hypercholesterolemia, unspecified: Secondary | ICD-10-CM | POA: Diagnosis not present

## 2018-04-15 DIAGNOSIS — I10 Essential (primary) hypertension: Secondary | ICD-10-CM | POA: Diagnosis not present

## 2018-04-15 DIAGNOSIS — Z6827 Body mass index (BMI) 27.0-27.9, adult: Secondary | ICD-10-CM | POA: Diagnosis not present

## 2018-04-15 DIAGNOSIS — Z0000 Encounter for general adult medical examination without abnormal findings: Secondary | ICD-10-CM | POA: Diagnosis not present

## 2018-04-15 DIAGNOSIS — E039 Hypothyroidism, unspecified: Secondary | ICD-10-CM | POA: Diagnosis not present

## 2018-04-15 DIAGNOSIS — Z79899 Other long term (current) drug therapy: Secondary | ICD-10-CM | POA: Diagnosis not present

## 2018-04-15 DIAGNOSIS — M818 Other osteoporosis without current pathological fracture: Secondary | ICD-10-CM | POA: Diagnosis not present

## 2018-05-28 DIAGNOSIS — M1712 Unilateral primary osteoarthritis, left knee: Secondary | ICD-10-CM | POA: Diagnosis not present

## 2018-05-28 DIAGNOSIS — M25562 Pain in left knee: Secondary | ICD-10-CM | POA: Diagnosis not present

## 2018-06-17 DIAGNOSIS — M810 Age-related osteoporosis without current pathological fracture: Secondary | ICD-10-CM | POA: Diagnosis not present

## 2018-06-17 DIAGNOSIS — Z1231 Encounter for screening mammogram for malignant neoplasm of breast: Secondary | ICD-10-CM | POA: Diagnosis not present

## 2018-06-17 DIAGNOSIS — M8589 Other specified disorders of bone density and structure, multiple sites: Secondary | ICD-10-CM | POA: Diagnosis not present

## 2018-11-01 DIAGNOSIS — M818 Other osteoporosis without current pathological fracture: Secondary | ICD-10-CM | POA: Diagnosis not present

## 2018-11-01 DIAGNOSIS — E039 Hypothyroidism, unspecified: Secondary | ICD-10-CM | POA: Diagnosis not present

## 2018-11-01 DIAGNOSIS — I10 Essential (primary) hypertension: Secondary | ICD-10-CM | POA: Diagnosis not present

## 2018-11-15 DIAGNOSIS — I10 Essential (primary) hypertension: Secondary | ICD-10-CM | POA: Diagnosis not present

## 2018-11-15 DIAGNOSIS — M818 Other osteoporosis without current pathological fracture: Secondary | ICD-10-CM | POA: Diagnosis not present

## 2018-11-15 DIAGNOSIS — E039 Hypothyroidism, unspecified: Secondary | ICD-10-CM | POA: Diagnosis not present

## 2019-04-04 DIAGNOSIS — Z23 Encounter for immunization: Secondary | ICD-10-CM | POA: Diagnosis not present

## 2019-07-17 DIAGNOSIS — Z0000 Encounter for general adult medical examination without abnormal findings: Secondary | ICD-10-CM | POA: Diagnosis not present

## 2019-07-17 DIAGNOSIS — M818 Other osteoporosis without current pathological fracture: Secondary | ICD-10-CM | POA: Diagnosis not present

## 2019-07-17 DIAGNOSIS — I10 Essential (primary) hypertension: Secondary | ICD-10-CM | POA: Diagnosis not present

## 2019-07-17 DIAGNOSIS — Z6828 Body mass index (BMI) 28.0-28.9, adult: Secondary | ICD-10-CM | POA: Diagnosis not present

## 2019-07-17 DIAGNOSIS — E663 Overweight: Secondary | ICD-10-CM | POA: Diagnosis not present

## 2019-07-17 DIAGNOSIS — Z853 Personal history of malignant neoplasm of breast: Secondary | ICD-10-CM | POA: Diagnosis not present

## 2019-07-17 DIAGNOSIS — E039 Hypothyroidism, unspecified: Secondary | ICD-10-CM | POA: Diagnosis not present

## 2019-08-28 DIAGNOSIS — Z1231 Encounter for screening mammogram for malignant neoplasm of breast: Secondary | ICD-10-CM | POA: Diagnosis not present

## 2019-09-10 DIAGNOSIS — R921 Mammographic calcification found on diagnostic imaging of breast: Secondary | ICD-10-CM | POA: Diagnosis not present

## 2019-09-10 DIAGNOSIS — R928 Other abnormal and inconclusive findings on diagnostic imaging of breast: Secondary | ICD-10-CM | POA: Diagnosis not present

## 2019-09-10 DIAGNOSIS — N853 Subinvolution of uterus: Secondary | ICD-10-CM | POA: Diagnosis not present

## 2019-09-24 DIAGNOSIS — D0591 Unspecified type of carcinoma in situ of right breast: Secondary | ICD-10-CM | POA: Diagnosis not present

## 2019-09-24 DIAGNOSIS — D0511 Intraductal carcinoma in situ of right breast: Secondary | ICD-10-CM | POA: Diagnosis not present

## 2019-09-24 DIAGNOSIS — R921 Mammographic calcification found on diagnostic imaging of breast: Secondary | ICD-10-CM | POA: Diagnosis not present

## 2019-10-02 DIAGNOSIS — D0511 Intraductal carcinoma in situ of right breast: Secondary | ICD-10-CM | POA: Diagnosis not present

## 2019-10-09 DIAGNOSIS — Z1152 Encounter for screening for COVID-19: Secondary | ICD-10-CM | POA: Diagnosis not present

## 2019-10-13 DIAGNOSIS — D0511 Intraductal carcinoma in situ of right breast: Secondary | ICD-10-CM | POA: Diagnosis not present

## 2019-10-13 DIAGNOSIS — Z9882 Breast implant status: Secondary | ICD-10-CM | POA: Diagnosis not present

## 2019-10-13 DIAGNOSIS — Z79899 Other long term (current) drug therapy: Secondary | ICD-10-CM | POA: Diagnosis not present

## 2019-10-13 DIAGNOSIS — M1990 Unspecified osteoarthritis, unspecified site: Secondary | ICD-10-CM | POA: Diagnosis not present

## 2019-10-13 DIAGNOSIS — C50911 Malignant neoplasm of unspecified site of right female breast: Secondary | ICD-10-CM | POA: Diagnosis not present

## 2019-10-13 DIAGNOSIS — E039 Hypothyroidism, unspecified: Secondary | ICD-10-CM | POA: Diagnosis not present

## 2019-10-13 DIAGNOSIS — N642 Atrophy of breast: Secondary | ICD-10-CM | POA: Diagnosis not present

## 2019-10-13 DIAGNOSIS — I10 Essential (primary) hypertension: Secondary | ICD-10-CM | POA: Diagnosis not present

## 2019-10-21 DIAGNOSIS — D0511 Intraductal carcinoma in situ of right breast: Secondary | ICD-10-CM | POA: Diagnosis not present

## 2019-10-24 DIAGNOSIS — D0511 Intraductal carcinoma in situ of right breast: Secondary | ICD-10-CM | POA: Diagnosis not present

## 2019-10-24 DIAGNOSIS — Z170 Estrogen receptor positive status [ER+]: Secondary | ICD-10-CM | POA: Diagnosis not present

## 2019-12-31 DIAGNOSIS — M818 Other osteoporosis without current pathological fracture: Secondary | ICD-10-CM | POA: Diagnosis not present

## 2020-01-09 ENCOUNTER — Encounter: Payer: Self-pay | Admitting: Genetic Counselor

## 2020-02-23 DIAGNOSIS — Z6828 Body mass index (BMI) 28.0-28.9, adult: Secondary | ICD-10-CM | POA: Diagnosis not present

## 2020-02-23 DIAGNOSIS — D0511 Intraductal carcinoma in situ of right breast: Secondary | ICD-10-CM | POA: Diagnosis not present

## 2020-02-23 DIAGNOSIS — E039 Hypothyroidism, unspecified: Secondary | ICD-10-CM | POA: Diagnosis not present

## 2020-02-23 DIAGNOSIS — I10 Essential (primary) hypertension: Secondary | ICD-10-CM | POA: Diagnosis not present

## 2020-02-23 DIAGNOSIS — Z6829 Body mass index (BMI) 29.0-29.9, adult: Secondary | ICD-10-CM | POA: Diagnosis not present

## 2020-02-23 DIAGNOSIS — E663 Overweight: Secondary | ICD-10-CM | POA: Diagnosis not present

## 2020-02-23 DIAGNOSIS — M7060 Trochanteric bursitis, unspecified hip: Secondary | ICD-10-CM | POA: Diagnosis not present

## 2020-03-31 DIAGNOSIS — Z23 Encounter for immunization: Secondary | ICD-10-CM | POA: Diagnosis not present

## 2020-05-11 ENCOUNTER — Telehealth: Payer: Self-pay | Admitting: Oncology

## 2020-05-11 NOTE — Telephone Encounter (Signed)
Patient called to get her next Follow Up Appt with Dr Hinton Rao.  Mosaqi reveals patient is to have a Follow Up around Jun 08, 2020.  Scheduled patient for Jan 10

## 2020-05-14 DIAGNOSIS — D0511 Intraductal carcinoma in situ of right breast: Secondary | ICD-10-CM | POA: Diagnosis not present

## 2020-06-02 ENCOUNTER — Telehealth: Payer: Self-pay | Admitting: Oncology

## 2020-06-02 NOTE — Telephone Encounter (Signed)
06/02/20 spoke with patient and reschedule appt

## 2020-06-10 ENCOUNTER — Ambulatory Visit: Payer: Medicare Other | Admitting: Oncology

## 2020-06-25 NOTE — Progress Notes (Incomplete)
[facility]  [address] [place],  [place]  [address] [phone]  Clinic Day:  06/25/2020  Referring physician: No ref. provider found   This document serves as a record of services personally performed by Hosie Poisson, MD. It was created on their behalf by Curry,Lauren E, a trained medical scribe. The creation of this record is based on the scribe's personal observations and the provider's statements to them.   CHIEF COMPLAINT:  CC: Stage 0 ductal carcinoma in situ  Current Treatment:  Surveillance   HISTORY OF PRESENT ILLNESS:  Heidi Barnes is a 70 y.o. female with stage 0 ductal carcinoma in situ of the right breast, diagnosed in April 2021.  She has been referred by Dr. Orrin Brigham.  She has a history of stage IA invasive ductal carcinoma with high grade ductal carcinoma in situ of the right breast diagnosed in September 2007, when she was 70 years old.  This was treated with lumpectomy, adjuvant radiation, and hormonal therapy with tamoxifen for 5 years.  BRCA testing was negative.  She underwent annual screening bilateral mammogram on April 1st 2021 which showed calcifications and possible asymmetry with calcifications of the right breast.  Left breast was negative.  Diagnostic unilateral right mammogram then revealed an indeterminate 5 mm group of calcifications involving the lower outer subareolar right breast.  She then underwent stereotactic biopsy on April 28th and surgical pathology revealed ductal carcinoma in situ, intermediate nuclear grade with calcifications and necrosis.  Estrogen receptor was positive at 100% and progesterone receptor was positive at 50%.  She was therefore referred to Dr. Lilia Pro and on May 17th underwent right breast lumpectomy.  Final pathology from this procedure confirmed ductal carcinoma in situ, intermediate nuclear grade, measuring 2 mm.  No invasive carcinoma identified.  Margins free of  neoplasm.  She is here for followup and states that she has been well other than bursitis in both her hips.  She has been switched from Boniva to Prolia every 6 months by Dr. Burnett Sheng to treat her osteoporosis.  She undergoes routine blood work at her office as well.  Her appetite is good, and she has gained 1 1/2 pounds since her last visit.  She denies fever or chills.  She denies nausea, vomiting, bowel issues, or abdominal pain.  She denies sore throat, cough, dyspnea, or chest pain.  Her mother had invasive breast cancer and as well as 2 paternal aunts with breast cancer and an uncle with unknown type of cancer.   INTERVAL HISTORY:  Heidi Barnes is here for routine follow up ***.  She is scheduled for annual mammogram and follow up with Dr. Lilia Pro in April.   Her  appetite is good, and she has gained/lost _ pounds since her last visit.  She denies fever, chills or other signs of infection.  She denies nausea, vomiting, bowel issues, or abdominal pain.  She denies sore throat, cough, dyspnea, or chest pain.  REVIEW OF SYSTEMS:  Review of Systems - Oncology   VITALS:  There were no vitals taken for this visit.  Wt Readings from Last 3 Encounters:  No data found for Wt    There is no height or weight on file to calculate BMI.  Performance status (ECOG): {CHL ONC [id number]  PHYSICAL EXAM:  Physical Exam  LABS:   CBC Latest Ref Rng & Units 07/19/2007 06/20/2007 12/21/2006  WBC 3.9 - 10.0 10e3/uL - 3.8(L) 4.8  Hemoglobin - 14.3 12.4 12.7  Hematocrit - 42.0 36.1 36.3  Platelets 145 - 400 10e3/uL - 156 157   CMP Latest Ref Rng & Units 07/19/2007 06/20/2007 12/21/2006  Glucose - 96 105(H) 106(H)  BUN 6 - 23 mg/dL - 12 12  Creatinine 0.40 - 1.20 mg/dL - 0.86 0.80  Sodium - 142 140 143  Potassium - 4.0 4.3 3.9  Chloride 96 - 112 mEq/L - 103 107  CO2 19 - 32 mEq/L - 28 27  Calcium 8.4 - 10.5 mg/dL - 9.2 9.7  Total Protein 6.0 - 8.3 g/dL - 6.9 7.1  Total Bilirubin 0.3 - 1.2 mg/dL - 0.4 0.6   Alkaline Phos 39 - 117 U/L - 80 73  AST 0 - 37 U/L - 22 25  ALT 0 - 35 U/L - 19 23     No results found for: CEA1 / No results found for: CEA1 No results found for: PSA1 No results found for: FIE332 No results found for: CAN125  No results found for: TOTALPROTELP, ALBUMINELP, A1GS, A2GS, BETS, BETA2SER, GAMS, MSPIKE, SPEI No results found for: TIBC, FERRITIN, IRONPCTSAT Lab Results  Component Value Date   LDH 149 06/20/2007   LDH 159 10/04/2006   LDH 177 02/20/2006     STUDIES:  No results found.   Allergies: Not on File  Current Medications: No current outpatient medications on file.   No current facility-administered medications for this visit.     ASSESSMENT & PLAN:   Assessment:   1.  History of stage I (T1A N0 M0) invasive ductal carcinoma with high grade ductal carcinoma in situ of the right breast diagnosed in September 2007.  This was treated with lumpectomy, adjuvant radiation and tamoxifen for 5 years.  She reports that her genetic testing was negative, but we will plan to request these reports from [facility].  Most likely she just had testing of BRCA 1 and 2 and she is probably a candidate for updated genetic testing.    2.  New stage 0 ductal carcinoma in situ diagnosed in April 2021.  Estrogen and progesterone receptors were positive.  Her prognosis is excellent for this tiny lesion treated with lumpectomy alone.  She needs no further treatment for this, but we did discuss chemoprevention with Raloxifene.  3.  Osteoporosis.  Her last bone density was in January 2020.  She has been switched from Boniva to Prolia every 6 months.  4.  Hypercalcemia.  I believe this is long standing and she tells me she has had an ultrasound of her parathyroid glands in the past.   5. Strong family history for breast cancer.    Plan: She has been switched from Boniva to Prolia every 6 months by Dr. Burnett Sheng.  As she continues to do well, we will see her back in 3 months for  repeat examination.  She understands and agrees to this plan of care.  I have answered her questions and she knows to call with any concerns.    I provided *** minutes (8:45 AM - 8:45 AM) of face-to-face time during this this encounter and > 50% was spent counseling as documented under my assessment and plan.    Derwood Kaplan, MD [facility] [address] [place] [address] Dept: [phone] Dept Fax: [phone]   I, Rita Ohara, am acting as scribe for Derwood Kaplan, MD  I have reviewed this report as typed by the medical scribe, and it is complete  and accurate.

## 2020-06-28 ENCOUNTER — Telehealth: Payer: Self-pay | Admitting: Hematology and Oncology

## 2020-06-28 ENCOUNTER — Inpatient Hospital Stay: Payer: Medicare Other | Attending: Oncology | Admitting: Hematology and Oncology

## 2020-06-28 ENCOUNTER — Other Ambulatory Visit: Payer: Self-pay | Admitting: Hematology and Oncology

## 2020-06-28 ENCOUNTER — Other Ambulatory Visit: Payer: Self-pay

## 2020-06-28 ENCOUNTER — Encounter: Payer: Self-pay | Admitting: Hematology and Oncology

## 2020-06-28 VITALS — BP 187/81 | HR 69 | Temp 98.8°F | Resp 18 | Ht 66.0 in | Wt 167.8 lb

## 2020-06-28 DIAGNOSIS — D0591 Unspecified type of carcinoma in situ of right breast: Secondary | ICD-10-CM | POA: Diagnosis not present

## 2020-06-28 MED ORDER — RALOXIFENE HCL 60 MG PO TABS: 60.0000 mg | ORAL_TABLET | Freq: Every day | ORAL | 3 refills | Status: DC

## 2020-06-28 NOTE — Progress Notes (Addendum)
[place]  [address] [employer],  [place]  [address] [phone]  Clinic Day:  06/28/2020  Referring physician: No ref. provider found   CHIEF COMPLAINT:  CC: A 70 year old with stage 0 ductal carcinoma in situ  Current Treatment:  Raloxifene   HISTORY OF PRESENT ILLNESS:  Heidi Barnes is a 69 y.o. female with a history of  newly diagnosed stage 0 ductal carcinoma in situ of the right breast.  She has been referred by Dr. Orrin Brigham.  She has a history of stage IA invasive ductal carcinoma with high grade ductal carcinoma in situ of the right breast diagnosed in September 2007, when she was 70 years old.  This was treated with lumpectomy, adjuvant radiation, and hormonal therapy with tamoxifen for 5 years.  BRCA testing was negative.  She underwent annual screening bilateral mammogram on April 1st 2021 which showed calcifications and possible asymmetry with calcifications of the right breast.  Left breast was negative.  Diagnostic unilateral right mammogram then revealed an indeterminate 5 mm group of calcifications involving the lower outer subareolar right breast.  She then underwent stereotactic biopsy on April 28th and surgical pathology revealed ductal carcinoma in situ, intermediate nuclear grade with calcifications and necrosis.  Estrogen receptor was positive at 100% and progesterone receptor was positive at 50%.  She was therefore referred to Dr. Lilia Pro and on May 17th underwent right breast lumpectomy.  Final pathology from this procedure confirmed ductal carcinoma in situ, intermediate nuclear grade, measuring 2 mm.  No invasive carcinoma identified.  Margins free of neoplasm.  She was started on Raloxifene and switched to Prolia injections for her osteoporosis.   INTERVAL HISTORY:  Heidi Barnes is here today for routine follow up of her stage 0 ductal carcinoma in situ of the right breast. She underwent lumpectomy and opted for  Raloxifene as chemoprevention and will continue this for 5 years. She has been tolerating this well.  She also continues with Prolia injections for osteoporosis and is due at the end of next month for this injection. She denies fevers or chills. She  denies pain. Her appetite is good. Her weight has been stable. She denies shortness of breath, cough or chest pain. She denies issue with bowel or bladder.   REVIEW OF SYSTEMS:  Review of Systems  Constitutional: Negative for appetite change, chills, diaphoresis, fatigue, fever and unexpected weight change.  HENT:   Negative for hearing loss, lump/mass, mouth sores, nosebleeds, sore throat, tinnitus, trouble swallowing and voice change.   Eyes: Negative for eye problems and icterus.  Respiratory: Negative for chest tightness, cough, hemoptysis, shortness of breath and wheezing.   Cardiovascular: Negative for chest pain, leg swelling and palpitations.  Gastrointestinal: Negative for abdominal distention, abdominal pain, blood in stool, constipation, diarrhea, nausea, rectal pain and vomiting.  Endocrine: Negative for hot flashes.  Genitourinary: Negative for bladder incontinence, difficulty urinating, dyspareunia, dysuria, frequency, hematuria and nocturia.   Musculoskeletal: Negative for arthralgias, back pain, flank pain, gait problem, myalgias, neck pain and neck stiffness.  Skin: Negative for itching, rash and wound.  Neurological: Negative for dizziness, extremity weakness, gait problem, headaches, light-headedness, numbness, seizures and speech difficulty.  Hematological: Negative for adenopathy. Does not bruise/bleed easily.  Psychiatric/Behavioral: Negative for confusion, decreased concentration, depression, sleep disturbance and suicidal ideas. The patient is not nervous/anxious.      VITALS:  Blood pressure (!) 187/81, pulse 69, temperature 98.8 F (37.1 C), temperature source Oral, resp. rate 18, height  $'5\' 6"'n$  (1.676 m), weight 167 lb 12.8  oz (76.1 kg), SpO2 99 %.  Wt Readings from Last 3 Encounters:  06/28/20 167 lb 12.8 oz (76.1 kg)    Body mass index is 27.08 kg/m.  Performance status (ECOG): 0 - Asymptomatic  PHYSICAL EXAM:  Physical Exam Constitutional:      General: She is not in acute distress.    Appearance: Normal appearance. She is normal weight. She is not ill-appearing, toxic-appearing or diaphoretic.  HENT:     Head: Normocephalic and atraumatic.     Right Ear: Tympanic membrane normal.     Left Ear: Tympanic membrane normal.     Nose: Nose normal. No congestion or rhinorrhea.     Mouth/Throat:     Mouth: Mucous membranes are moist.     Pharynx: Oropharynx is clear. No oropharyngeal exudate or posterior oropharyngeal erythema.  Eyes:     General: No scleral icterus.       Right eye: No discharge.        Left eye: No discharge.     Extraocular Movements: Extraocular movements intact.     Conjunctiva/sclera: Conjunctivae normal.     Pupils: Pupils are equal, round, and reactive to light.  Neck:     Vascular: No carotid bruit.  Cardiovascular:     Rate and Rhythm: Normal rate and regular rhythm.     Heart sounds: No murmur heard. No friction rub. No gallop.   Pulmonary:     Effort: Pulmonary effort is normal. No respiratory distress.     Breath sounds: Normal breath sounds. No stridor. No wheezing, rhonchi or rales.  Chest:     Chest wall: No mass, lacerations, deformity, swelling, tenderness, crepitus or edema. There is no dullness to percussion.  Breasts: Breasts are symmetrical.     Right: Normal. No swelling, bleeding, inverted nipple, mass, nipple discharge, skin change, tenderness, axillary adenopathy or supraclavicular adenopathy.     Left: Normal. No swelling, bleeding, inverted nipple, mass, nipple discharge, skin change, tenderness, axillary adenopathy or supraclavicular adenopathy.    Abdominal:     General: Abdomen is flat. Bowel sounds are normal. There is no distension.      Palpations: There is no mass.     Tenderness: There is no abdominal tenderness. There is no right CVA tenderness, left CVA tenderness, guarding or rebound.     Hernia: No hernia is present.  Musculoskeletal:        General: No swelling, tenderness, deformity or signs of injury. Normal range of motion.     Cervical back: Normal range of motion and neck supple. No rigidity or tenderness.     Right lower leg: No edema.     Left lower leg: No edema.  Lymphadenopathy:     Cervical: No cervical adenopathy.     Upper Body:     Right upper body: No supraclavicular, axillary or pectoral adenopathy.     Left upper body: No supraclavicular, axillary or pectoral adenopathy.  Skin:    General: Skin is warm and dry.     Capillary Refill: Capillary refill takes less than 2 seconds.     Coloration: Skin is not jaundiced or pale.     Findings: No bruising, erythema, lesion or rash.  Neurological:     General: No focal deficit present.     Mental Status: She is alert and oriented to person, place, and time. Mental status is at baseline.     Cranial Nerves: No cranial nerve deficit.  Sensory: No sensory deficit.     Motor: No weakness.     Coordination: Coordination normal.     Gait: Gait normal.     Deep Tendon Reflexes: Reflexes normal.  Psychiatric:        Mood and Affect: Mood normal.        Behavior: Behavior normal.        Thought Content: Thought content normal.        Judgment: Judgment normal.     LABS:   CBC Latest Ref Rng & Units 07/19/2007 06/20/2007 12/21/2006  WBC 3.9 - 10.0 10e3/uL - 3.8(L) 4.8  Hemoglobin - 14.3 12.4 12.7  Hematocrit - 42.0 36.1 36.3  Platelets 145 - 400 10e3/uL - 156 157   CMP Latest Ref Rng & Units 07/19/2007 06/20/2007 12/21/2006  Glucose - 96 105(H) 106(H)  BUN 6 - 23 mg/dL - 12 12  Creatinine 8.20 - 1.20 mg/dL - 6.13 5.60  Sodium - 906 140 143  Potassium - 4.0 4.3 3.9  Chloride 96 - 112 mEq/L - 103 107  CO2 19 - 32 mEq/L - 28 27  Calcium 8.4 - 10.5  mg/dL - 9.2 9.7  Total Protein 6.0 - 8.3 g/dL - 6.9 7.1  Total Bilirubin 0.3 - 1.2 mg/dL - 0.4 0.6  Alkaline Phos 39 - 117 U/L - 80 73  AST 0 - 37 U/L - 22 25  ALT 0 - 35 U/L - 19 23     No results found for: CEA1 / No results found for: CEA1 No results found for: PSA1 No results found for: EJF307 No results found for: CAN125  No results found for: TOTALPROTELP, ALBUMINELP, A1GS, A2GS, BETS, BETA2SER, GAMS, MSPIKE, SPEI No results found for: TIBC, FERRITIN, IRONPCTSAT Lab Results  Component Value Date   LDH 149 06/20/2007   LDH 159 10/04/2006   LDH 177 02/20/2006    STUDIES:  No results found.    HISTORY:  No past medical history on file.  No past surgical history on file.  No family history on file.  Social History:  reports that she has never smoked. She has never used smokeless tobacco. She reports that she does not drink alcohol and does not use drugs.The patient is alone  today.  Allergies: No Known Allergies  Current Medications: Current Outpatient Medications  Medication Sig Dispense Refill   Cholecalciferol (VITAMIN D3) 50 MCG (2000 UT) CAPS Take by mouth.     vitamin C (ASCORBIC ACID) 500 MG tablet Take 500 mg by mouth daily.     zinc gluconate 50 MG tablet Take 50 mg by mouth daily.     lisinopril (ZESTRIL) 5 MG tablet Take 5 mg by mouth daily.     raloxifene (EVISTA) 60 MG tablet Take 1 tablet (60 mg total) by mouth daily. 90 tablet 3   SYNTHROID 75 MCG tablet Take by mouth.     No current facility-administered medications for this visit.     ASSESSMENT & PLAN:   Assessment:  Heidi Barnes is a 70 y.o. female  1. History of stage I (T1A N0 M0) invasive ductal carcinoma with high grade ductal carcinoma in situ of the right breast diagnosed in September 2007.  This was treated with lumpectomy, adjuvant radiation and tamoxifen for 5 years.  She reports that her genetic testing was negative, but we will plan to request these reports from  [facility].  Most likely she just had testing of BRCA 1 and 2 and she is  probably a candidate for updated genetic testing.   2.  New stage 0 ductal carcinoma in situ diagnosed in April 2021.  Estrogen and progesterone receptors were positive.  Her prognosis is excellent for this tiny lesion treated with lumpectomy alone.  She needs no further treatment for this, but she is on chemoprevention with Raloxifene. 3.  Osteoporosis.  Her last bone density was in January 2020.  She is currently Prolla injections. Even though she is on Raloxifene, this is not the treatment of choice for significant osteoporosis. 4.  Hypercalcemia.  I believe this is long standing and she tells me she has had an ultrasound of her parathyroid glands in the past.       Plan: .    She will continue with Raloxifene daily. I will refill this prescription today. She will continue with Prolia injections that are managed by her PCP. She will continue to follow with Dr. Lilia Pro whom she saw last month. We will see her back in the clinic in 3 months for examination.   The patient understands the plans discussed today and is in agreement with them.  She knows to contact our office if she develops concerns prior to her next appointment.   I provided 20 minutes of face-to-face time during this this encounter and > 50% was spent counseling as documented under my assessment and plan.    Melodye Ped, NP

## 2020-06-28 NOTE — Telephone Encounter (Signed)
Per 1/31 los next appt sched and given to patient 

## 2020-06-29 ENCOUNTER — Telehealth: Payer: Self-pay | Admitting: *Deleted

## 2020-06-29 NOTE — Telephone Encounter (Signed)
Pt received pfizer vaccine 1st dose in jan 2021 and 2nd dose in feb 2021.Pt does not have the date available . Pt had vaccines at [facility]. Pt not sure if she will get the booster

## 2020-07-19 DIAGNOSIS — Z0000 Encounter for general adult medical examination without abnormal findings: Secondary | ICD-10-CM | POA: Diagnosis not present

## 2020-07-19 DIAGNOSIS — C50511 Malignant neoplasm of lower-outer quadrant of right female breast: Secondary | ICD-10-CM | POA: Diagnosis not present

## 2020-07-19 DIAGNOSIS — E663 Overweight: Secondary | ICD-10-CM | POA: Diagnosis not present

## 2020-07-19 DIAGNOSIS — E039 Hypothyroidism, unspecified: Secondary | ICD-10-CM | POA: Diagnosis not present

## 2020-07-19 DIAGNOSIS — Z6829 Body mass index (BMI) 29.0-29.9, adult: Secondary | ICD-10-CM | POA: Diagnosis not present

## 2020-07-19 DIAGNOSIS — M818 Other osteoporosis without current pathological fracture: Secondary | ICD-10-CM | POA: Diagnosis not present

## 2020-07-19 DIAGNOSIS — Z23 Encounter for immunization: Secondary | ICD-10-CM | POA: Diagnosis not present

## 2020-07-19 DIAGNOSIS — I10 Essential (primary) hypertension: Secondary | ICD-10-CM | POA: Diagnosis not present

## 2020-08-30 DIAGNOSIS — R922 Inconclusive mammogram: Secondary | ICD-10-CM | POA: Diagnosis not present

## 2020-08-30 DIAGNOSIS — D0511 Intraductal carcinoma in situ of right breast: Secondary | ICD-10-CM | POA: Diagnosis not present

## 2020-09-23 ENCOUNTER — Telehealth: Payer: Self-pay | Admitting: Oncology

## 2020-09-23 NOTE — Telephone Encounter (Signed)
09/23/20 Patient cancelled appt and did not want to reschedule at this time

## 2020-09-27 ENCOUNTER — Inpatient Hospital Stay: Payer: Medicare Other | Admitting: Nurse Practitioner

## 2020-12-19 DIAGNOSIS — Z20828 Contact with and (suspected) exposure to other viral communicable diseases: Secondary | ICD-10-CM | POA: Diagnosis not present

## 2020-12-19 DIAGNOSIS — R051 Acute cough: Secondary | ICD-10-CM | POA: Diagnosis not present

## 2020-12-19 DIAGNOSIS — R509 Fever, unspecified: Secondary | ICD-10-CM | POA: Diagnosis not present

## 2020-12-19 DIAGNOSIS — R0981 Nasal congestion: Secondary | ICD-10-CM | POA: Diagnosis not present

## 2021-01-28 DIAGNOSIS — I10 Essential (primary) hypertension: Secondary | ICD-10-CM | POA: Diagnosis not present

## 2021-01-28 DIAGNOSIS — E038 Other specified hypothyroidism: Secondary | ICD-10-CM | POA: Diagnosis not present

## 2021-01-28 DIAGNOSIS — Z6828 Body mass index (BMI) 28.0-28.9, adult: Secondary | ICD-10-CM | POA: Diagnosis not present

## 2021-01-28 DIAGNOSIS — M818 Other osteoporosis without current pathological fracture: Secondary | ICD-10-CM | POA: Diagnosis not present

## 2021-02-06 DIAGNOSIS — S92354A Nondisplaced fracture of fifth metatarsal bone, right foot, initial encounter for closed fracture: Secondary | ICD-10-CM | POA: Diagnosis not present

## 2021-02-08 DIAGNOSIS — M79671 Pain in right foot: Secondary | ICD-10-CM | POA: Diagnosis not present

## 2021-02-08 DIAGNOSIS — S92351A Displaced fracture of fifth metatarsal bone, right foot, initial encounter for closed fracture: Secondary | ICD-10-CM | POA: Diagnosis not present

## 2021-03-01 DIAGNOSIS — S92351A Displaced fracture of fifth metatarsal bone, right foot, initial encounter for closed fracture: Secondary | ICD-10-CM | POA: Diagnosis not present

## 2021-03-24 DIAGNOSIS — Z23 Encounter for immunization: Secondary | ICD-10-CM | POA: Diagnosis not present

## 2021-04-19 ENCOUNTER — Other Ambulatory Visit: Payer: Self-pay | Admitting: Hematology and Oncology

## 2021-04-19 DIAGNOSIS — D0511 Intraductal carcinoma in situ of right breast: Secondary | ICD-10-CM

## 2021-08-24 DIAGNOSIS — M818 Other osteoporosis without current pathological fracture: Secondary | ICD-10-CM | POA: Diagnosis not present

## 2021-09-15 DIAGNOSIS — E663 Overweight: Secondary | ICD-10-CM | POA: Diagnosis not present

## 2021-09-15 DIAGNOSIS — I10 Essential (primary) hypertension: Secondary | ICD-10-CM | POA: Diagnosis not present

## 2021-09-15 DIAGNOSIS — Z6829 Body mass index (BMI) 29.0-29.9, adult: Secondary | ICD-10-CM | POA: Diagnosis not present

## 2021-09-15 DIAGNOSIS — E7800 Pure hypercholesterolemia, unspecified: Secondary | ICD-10-CM | POA: Diagnosis not present

## 2021-09-15 DIAGNOSIS — E039 Hypothyroidism, unspecified: Secondary | ICD-10-CM | POA: Diagnosis not present

## 2021-09-15 DIAGNOSIS — Z0000 Encounter for general adult medical examination without abnormal findings: Secondary | ICD-10-CM | POA: Diagnosis not present

## 2021-09-29 DIAGNOSIS — Z853 Personal history of malignant neoplasm of breast: Secondary | ICD-10-CM | POA: Diagnosis not present

## 2021-10-07 DIAGNOSIS — D0511 Intraductal carcinoma in situ of right breast: Secondary | ICD-10-CM | POA: Diagnosis not present

## 2021-10-20 DIAGNOSIS — M8589 Other specified disorders of bone density and structure, multiple sites: Secondary | ICD-10-CM | POA: Diagnosis not present

## 2021-11-10 DIAGNOSIS — L570 Actinic keratosis: Secondary | ICD-10-CM | POA: Diagnosis not present

## 2021-11-10 DIAGNOSIS — L578 Other skin changes due to chronic exposure to nonionizing radiation: Secondary | ICD-10-CM | POA: Diagnosis not present

## 2021-11-10 DIAGNOSIS — L814 Other melanin hyperpigmentation: Secondary | ICD-10-CM | POA: Diagnosis not present

## 2021-11-10 DIAGNOSIS — L821 Other seborrheic keratosis: Secondary | ICD-10-CM | POA: Diagnosis not present

## 2022-03-01 DIAGNOSIS — M818 Other osteoporosis without current pathological fracture: Secondary | ICD-10-CM | POA: Diagnosis not present

## 2022-03-22 DIAGNOSIS — Z23 Encounter for immunization: Secondary | ICD-10-CM | POA: Diagnosis not present

## 2022-03-23 DIAGNOSIS — E039 Hypothyroidism, unspecified: Secondary | ICD-10-CM | POA: Diagnosis not present

## 2022-03-23 DIAGNOSIS — Z6829 Body mass index (BMI) 29.0-29.9, adult: Secondary | ICD-10-CM | POA: Diagnosis not present

## 2022-03-23 DIAGNOSIS — E7800 Pure hypercholesterolemia, unspecified: Secondary | ICD-10-CM | POA: Diagnosis not present

## 2022-03-23 DIAGNOSIS — I10 Essential (primary) hypertension: Secondary | ICD-10-CM | POA: Diagnosis not present

## 2022-03-23 DIAGNOSIS — E663 Overweight: Secondary | ICD-10-CM | POA: Diagnosis not present

## 2022-03-30 ENCOUNTER — Other Ambulatory Visit: Payer: Self-pay | Admitting: Hematology and Oncology

## 2022-03-30 DIAGNOSIS — D0511 Intraductal carcinoma in situ of right breast: Secondary | ICD-10-CM

## 2022-04-05 ENCOUNTER — Telehealth: Payer: Self-pay | Admitting: Oncology

## 2022-04-05 NOTE — Telephone Encounter (Signed)
Contacted pt to schedule an appt. Unable to reach via phone, voicemail was left.   Scheduling Message Entered by Colt, AMY W on 04/05/2022 at  2:08 PM Priority: High EST PT 30  Department: [facility]  Provider: Derwood Kaplan, MD  Scheduling Notes:

## 2022-04-14 ENCOUNTER — Other Ambulatory Visit: Payer: Self-pay

## 2022-04-14 ENCOUNTER — Inpatient Hospital Stay: Payer: Medicare Other

## 2022-04-14 ENCOUNTER — Telehealth: Payer: Self-pay | Admitting: Oncology

## 2022-04-14 ENCOUNTER — Encounter: Payer: Self-pay | Admitting: Oncology

## 2022-04-14 ENCOUNTER — Inpatient Hospital Stay: Payer: Medicare Other | Attending: Oncology | Admitting: Oncology

## 2022-04-14 VITALS — BP 167/78 | HR 54 | Temp 97.7°F | Resp 17 | Ht 66.0 in | Wt 168.9 lb

## 2022-04-14 DIAGNOSIS — Z170 Estrogen receptor positive status [ER+]: Secondary | ICD-10-CM | POA: Diagnosis not present

## 2022-04-14 DIAGNOSIS — D0511 Intraductal carcinoma in situ of right breast: Secondary | ICD-10-CM | POA: Diagnosis not present

## 2022-04-14 DIAGNOSIS — C50911 Malignant neoplasm of unspecified site of right female breast: Secondary | ICD-10-CM | POA: Diagnosis not present

## 2022-04-14 DIAGNOSIS — M810 Age-related osteoporosis without current pathological fracture: Secondary | ICD-10-CM | POA: Diagnosis not present

## 2022-04-14 NOTE — Progress Notes (Signed)
[facility]  [address] [place],  [place]  [address] [phone]  Clinic Day:  04/14/22  Referring physician: Greig Right, MD   ASSESSMENT & PLAN:   Right breast cancer stage 1A diagnosed in 2007.  Estrogen and progesterone receptors were positive. She underwent chemotherapy, radiation, right lumpectomy and 5 years of Tamoxifen.  2.   Left breast DCIS stage 0 in 2021.  She is on chemoprevention with raloxifene.  No evidence of disease recurrence.  She will have repeat annual bilateral mammograms in May with Dr. Lilia Pro.  3.   Osteopenia. This is being treated by Dr. Burnett Sheng with Prolia injections.  Her last bone density scan in May 2023 shows definite improvement.   Plan: She is doing well today without any concerns. Bilateral mammogram in May 2023 negative for disease.  She is scheduled for repeat bilateral mammogram and follow up with Dr. Lilia Pro in May 2024. She had routine labs with Dr. Burnett Sheng.  I will see her back in 1 year for repeat examination.  I discussed the assessment and treatment plan with the patient.  The patient was provided an opportunity to ask questions and all were answered.  The patient agreed with the plan and demonstrated an understanding of the instructions.  The patient was advised to call back if the symptoms arise relating to her breast cancer.   I provided 25 minutes of face-to-face time during this this encounter and > 50% was spent counseling as documented under my assessment and plan.   No orders of the defined types were placed in this encounter.    Derwood Kaplan, MD [facility] AT [facility] [address] [place] [address] Dept: [phone] Dept Fax: [phone]    I,Alexis Herring,acting as a scribe for Derwood Kaplan, MD.,have documented all relevant documentation on the behalf of Derwood Kaplan, MD,as directed by  Derwood Kaplan,  MD while in the presence of Derwood Kaplan, MD.  I have reviewed this report as typed by the medical scribe, and it is complete and accurate.  Derwood Kaplan, MD   12/8/20235:37 PM  CHIEF COMPLAINT:  CC: History of right breast cancer stage 1A diagnosed in 2007 and left breast DCIS stage 0 in 2021 s/p lumpectomy  Current Treatment:  Surveillance  HISTORY OF PRESENT ILLNESS:  She has a history of right breast cancer infiltrating ductal grade 2 stage 1A diagnosed in 2007. Estrogen and progesterone receptors were positive. She underwent chemotherapy, radiation, right lumpectomy and 5 years of Tamoxifen. She then had left breast DCIS stage 0 diagnosed in April 2021 which required only lumpectomy. Post lumpectomy scans showed complete resection with clear margins. She was placed on chemoprevention with Raloxifene. She has a diagnosis of osteoporosis and is on therapy for this. She was switched from oral therapy to Prolia injections by Dr. Burnett Sheng. She sees her surgeon Dr. Lilia Pro yearly in May and he orders her annual mammograms.  INTERVAL HISTORY:  Yuleni is seen in the clinic for follow up. She reports that she is doing very well. She had a negative bilateral mammogram in May 2023.  She has not noticed any new lumps or bumps on routine self exams. She states that she regrets getting her breast implants. Her DEXA scan in May 2023 which showed improvement from osteoporosis to osteopenia in the spine and improvement of pre-existing osteopenia in the femur. She is currently receiving Prolia injections every 6 months.  She wonders if she should continue taking supplemental calcium. Her PCP Dr. Burnett Sheng has recommended that she remain on daily calcium. I advised that she continue to follow Dr. Florina Ou recommendations as she is routinely checking her calcium levels. She has chronic mild hypercalcemia. She denies signs of infection such as sore throat, sinus drainage, cough, or urinary symptoms.  She  denies fevers or recurrent chills. She denies pain. She denies nausea, vomiting, chest pain, dyspnea or cough. Her weight has been stable.  REVIEW OF SYSTEMS:  Review of Systems  Constitutional:  Negative for appetite change, chills, fever and unexpected weight change.  HENT:  Negative.  Negative for lump/mass, mouth sores and sore throat.   Eyes: Negative.   Respiratory: Negative.  Negative for chest tightness, cough, hemoptysis, shortness of breath and wheezing.   Cardiovascular: Negative.  Negative for chest pain, leg swelling and palpitations.  Gastrointestinal: Negative.  Negative for abdominal distention, abdominal pain, blood in stool, constipation, diarrhea, nausea and vomiting.  Endocrine: Negative.   Genitourinary: Negative.  Negative for difficulty urinating, dysuria, frequency and hematuria.   Musculoskeletal:  Negative for arthralgias, back pain, flank pain and gait problem.  Skin: Negative.   Neurological:  Negative for dizziness, extremity weakness, gait problem, headaches, light-headedness, numbness, seizures and speech difficulty.  Hematological: Negative.  Negative for adenopathy. Does not bruise/bleed easily.  Psychiatric/Behavioral: Negative.  Negative for depression and sleep disturbance. The patient is not nervous/anxious.       VITALS:  Blood pressure (!) 167/78, pulse (!) 54, temperature 97.7 F (36.5 C), temperature source Oral, resp. rate 17, height '5\' 6"'$  (1.676 m), weight 168 lb 14.4 oz (76.6 kg).  Wt Readings from Last 3 Encounters:  04/14/22 168 lb 14.4 oz (76.6 kg)  06/28/20 167 lb 12.8 oz (76.1 kg)    Body mass index is 27.26 kg/m.  Performance status (ECOG): 0 - Asymptomatic  PHYSICAL EXAM:   Physical Exam Exam conducted with a chaperone present.  Constitutional:      General: She is not in acute distress.    Appearance: Normal appearance. She is normal weight.  HENT:     Head: Normocephalic and atraumatic.  Eyes:     General: No scleral  icterus.    Extraocular Movements: Extraocular movements intact.     Conjunctiva/sclera: Conjunctivae normal.     Pupils: Pupils are equal, round, and reactive to light.  Cardiovascular:     Rate and Rhythm: Normal rate and regular rhythm.     Pulses: Normal pulses.     Heart sounds: Normal heart sounds. No murmur heard.    No friction rub. No gallop.  Pulmonary:     Effort: Pulmonary effort is normal. No respiratory distress.     Breath sounds: Normal breath sounds.  Chest:     Comments: One faded scar in upper outer quadrant of left breast. Scar tissue in lateral right and upper outer quadrant. No masses in either breast. Bilateral breast implants. Abdominal:     General: Bowel sounds are normal. There is no distension.     Palpations: Abdomen is soft. There is no hepatomegaly, splenomegaly or mass.     Tenderness: There is no abdominal tenderness.  Musculoskeletal:        General: Normal range of motion.     Cervical back: Normal range of motion and neck supple.     Right lower leg: No edema.     Left lower leg: No edema.  Lymphadenopathy:     Cervical: No cervical  adenopathy.     Upper Body:     Right upper body: No supraclavicular, axillary or pectoral adenopathy.     Left upper body: No supraclavicular, axillary or pectoral adenopathy.  Skin:    General: Skin is warm and dry.  Neurological:     General: No focal deficit present.     Mental Status: She is alert and oriented to person, place, and time. Mental status is at baseline.  Psychiatric:        Mood and Affect: Mood normal.        Behavior: Behavior normal.        Thought Content: Thought content normal.        Judgment: Judgment normal.      LABS:      Latest Ref Rng & Units 07/19/2007    7:47 AM 06/20/2007    2:25 PM 12/21/2006    2:50 PM  CBC  WBC 3.9 - 10.0 10e3/uL  3.8  4.8   Hemoglobin  14.3  12.4  12.7   Hematocrit  42.0  36.1  36.3   Platelets 145 - 400 10e3/uL  156  157       Latest Ref Rng &  Units 07/19/2007    7:47 AM 06/20/2007    2:25 PM 12/21/2006    2:50 PM  CMP  Glucose  96  105  106   BUN 6 - 23 mg/dL  12  12   Creatinine 0.40 - 1.20 mg/dL  0.86  0.80   Sodium  142  140  143   Potassium  4.0  4.3  3.9   Chloride 96 - 112 mEq/L  103  107   CO2 19 - 32 mEq/L  28  27   Calcium 8.4 - 10.5 mg/dL  9.2  9.7   Total Protein 6.0 - 8.3 g/dL  6.9  7.1   Total Bilirubin 0.3 - 1.2 mg/dL  0.4  0.6   Alkaline Phos 39 - 117 U/L  80  73   AST 0 - 37 U/L  22  25   ALT 0 - 35 U/L  19  23      No results found for: "CEA1", "CEA" / No results found for: "CEA1", "CEA" No results found for: "PSA1" No results found for: "EZM629" No results found for: "CAN125"  No results found for: "TOTALPROTELP", "ALBUMINELP", "A1GS", "A2GS", "BETS", "BETA2SER", "GAMS", "MSPIKE", "SPEI" No results found for: "TIBC", "FERRITIN", "IRONPCTSAT" Lab Results  Component Value Date   LDH 149 06/20/2007   LDH 159 10/04/2006   LDH 177 02/20/2006     STUDIES:   DIGITAL SCREENING BILATERAL MAMMOGRAM WITH IMPLANTS, 3D TOMO WITH  CAD  Date: 09/29/21 TECHNIQUE:  Bilateral digital diagnostic mammography and breast tomosynthesis  was performed. The images were evaluated with computer-aided  detection.  COMPARISON: Previous exam(s).  ACR Breast Density Category b: There are scattered areas of  fibroglandular density.  FINDINGS:  Full field routine and implant displaced views of both breast and a  magnification view of the RIGHT lumpectomy site are performed.  Changes of RIGHT lumpectomies are again noted.  No new or suspicious findings are noted within either breast.  Bilateral retropectoral implants are present.  IMPRESSION:  1. No new or suspicious findings within either breast.  2. RIGHT lumpectomy changes.   RECOMMENDATION:  Bilateral diagnostic mammogram in 1 year.  I have discussed the findings and recommendations with the patient.  If applicable, a reminder letter will be sent to  the patient   regarding the next appointment.  BI-RADS CATEGORY 2: Benign.   Electronically Signed  By: Margarette Canada M.D.  On: 09/29/2021 10:53    EXAM: 10/20/21 DUAL X-RAY ABSORPTIOMETRY (DXA) FOR BONE MINERAL DENSITY  IMPRESSION:  Garnell Vincelette completed a BMD test on 10/20/2021 using the [place] (analysis version: 13.60) manufactured by [employer].  The following summarizes the results of our evaluation.  TECHNOLOGIST: Loman Chroman  PATIENT BIOGRAPHICAL:  Name: AFTEN, LIPSEY  Patient ID: [id number] [facility] Birth Date: 1951/01/24 Height: 65.0 in.  Gender: Female Exam Date: 10/20/2021 Weight: 167.0 lbs.  Indications:  M85.89 History of Fracture (Adult) Fractures: RIGHT FOOT Treatments:  ASSESSMENT:  The BMD measured at Femur Neck Right is 0.736 g/cm2 with a T-score  of -2.2. This patient is considered osteopenic according to [facility]) criteria.  The scan quality is good. Exclusions: L3 was excluded due to  degenerative changes.  Patient does not meet criteria for FRAX assessment- Patient  currently taking Evista.  Site Region Measured Measured WHO Young Adult BMD  Date Age Classification T-score  AP Spine L1-L4 (L3) 10/20/2021 72.7 Osteopenia -1.9 0.946 g/cm2  AP Spine L1-L4 (L3) 06/17/2018 69.3 Osteoporosis -2.6 0.859 g/cm2  AP Spine L1-L4 (L3) 10/28/2014 65.7 Osteoporosis -2.7 0.851 g/cm2  DualFemur Neck Right 10/20/2021 72.7 Osteopenia -2.2 0.736 g/cm2  DualFemur Neck Right 06/17/2018 69.3 Osteopenia -2.4 0.705 g/cm2  DualFemur Neck Right 10/28/2014 65.7 Osteoporosis -2.6 0.670 g/cm2  DualFemur Total Mean 10/20/2021 72.7 Osteopenia -1.7 0.800 g/cm2  DualFemur Total Mean 06/17/2018 69.3 Osteopenia -1.8 0.777 g/cm2  DualFemur Total Mean 10/28/2014 65.7 Osteopenia -1.8 0.776 g/cm2  World Health Organization [facility]) criteria for post-menopausal Caucasian Women:  Normal: T-score at or above -1 SD  Osteopenia: T-score between -1 and -2.5 SD  Osteoporosis: T-score at or below  -2.5 SD   RECOMMENDATIONS:  1. All patients should optimize calcium and vitamin D intake.  2. Consider FDA- approved medical therapies in post menopausal women  and men aged 56 years and older, based on the following:  a. A hip or vertebral (clinical or morphometric) fracture.  b. T- score < 2.5 of the femoral neck or spine after appropriate  evaluation to exclude secondary causes.  c. Low bone mass (T- score between -1.0 and -2.5 at femoral neck or  spine) and a 10 -year probability of a hip  fracture > 3% or a 10 -year probability of a major osteoporosis-  related fracture > 20% based on the [place]- adapted  WHO algorithm.  d. Clinician judgement and/ or patient preferences may indicate  treatment for people with 10- year fracture probabilities above or  below these levels.  FOLLOW-UP:  People with diagnosed cases of osteoporosis or at high risk for  fracture should have regular bone mineral density tests. For  patients eligible for Medicare, routine testing is allowed once  every 2 years. The testing frequency can be increased to one year  for patients who have rapidly progressing disease, those who are  receiving medical therapy to restore bone mass, or have additional  risk factors.   I have reviewed this report and agree with the above findings.  Electronically Signed  By: Franki Cabot M.D.  On: 10/20/2021 11:46    Allergies: No Known Allergies  Current Medications: Current Outpatient Medications  Medication Sig Dispense Refill   Cholecalciferol (VITAMIN D3) 50 MCG (2000 UT) CAPS Take by mouth.     lisinopril (ZESTRIL) 5 MG tablet Take 5  mg by mouth daily.     raloxifene (EVISTA) 60 MG tablet TAKE ONE TABLET BY MOUTH ONCE DAILY 90 tablet 3   SYNTHROID 75 MCG tablet Take by mouth. 0.5 tablet Mon & Fri and whole tablet the rest of the week     vitamin C (ASCORBIC ACID) 500 MG tablet Take 500 mg by mouth daily.     zinc gluconate 50 MG tablet Take 50 mg by mouth daily.      No current facility-administered medications for this visit.

## 2022-04-14 NOTE — Telephone Encounter (Signed)
Patient has been scheduled for follow-up visit per 04/14/22 los. Pt given an appt calendar with date and time.

## 2022-05-03 DIAGNOSIS — C50919 Malignant neoplasm of unspecified site of unspecified female breast: Secondary | ICD-10-CM | POA: Insufficient documentation

## 2022-05-03 DIAGNOSIS — D0511 Intraductal carcinoma in situ of right breast: Secondary | ICD-10-CM | POA: Insufficient documentation

## 2022-05-03 DIAGNOSIS — M810 Age-related osteoporosis without current pathological fracture: Secondary | ICD-10-CM | POA: Insufficient documentation

## 2022-05-23 DIAGNOSIS — Z8739 Personal history of other diseases of the musculoskeletal system and connective tissue: Secondary | ICD-10-CM | POA: Diagnosis not present

## 2022-05-23 DIAGNOSIS — M545 Low back pain, unspecified: Secondary | ICD-10-CM | POA: Diagnosis not present

## 2022-06-05 DIAGNOSIS — D485 Neoplasm of uncertain behavior of skin: Secondary | ICD-10-CM | POA: Diagnosis not present

## 2022-06-05 DIAGNOSIS — L814 Other melanin hyperpigmentation: Secondary | ICD-10-CM | POA: Diagnosis not present

## 2022-06-05 DIAGNOSIS — B079 Viral wart, unspecified: Secondary | ICD-10-CM | POA: Diagnosis not present

## 2022-07-06 DIAGNOSIS — C44729 Squamous cell carcinoma of skin of left lower limb, including hip: Secondary | ICD-10-CM | POA: Diagnosis not present

## 2022-09-08 DIAGNOSIS — Z08 Encounter for follow-up examination after completed treatment for malignant neoplasm: Secondary | ICD-10-CM | POA: Diagnosis not present

## 2022-09-08 DIAGNOSIS — L821 Other seborrheic keratosis: Secondary | ICD-10-CM | POA: Diagnosis not present

## 2022-09-08 DIAGNOSIS — Z85828 Personal history of other malignant neoplasm of skin: Secondary | ICD-10-CM | POA: Diagnosis not present

## 2022-09-08 DIAGNOSIS — D225 Melanocytic nevi of trunk: Secondary | ICD-10-CM | POA: Diagnosis not present

## 2022-09-21 DIAGNOSIS — Z1389 Encounter for screening for other disorder: Secondary | ICD-10-CM | POA: Diagnosis not present

## 2022-09-21 DIAGNOSIS — M818 Other osteoporosis without current pathological fracture: Secondary | ICD-10-CM | POA: Diagnosis not present

## 2022-09-21 DIAGNOSIS — E7800 Pure hypercholesterolemia, unspecified: Secondary | ICD-10-CM | POA: Diagnosis not present

## 2022-09-21 DIAGNOSIS — E663 Overweight: Secondary | ICD-10-CM | POA: Diagnosis not present

## 2022-09-21 DIAGNOSIS — Z0000 Encounter for general adult medical examination without abnormal findings: Secondary | ICD-10-CM | POA: Diagnosis not present

## 2022-09-21 DIAGNOSIS — E039 Hypothyroidism, unspecified: Secondary | ICD-10-CM | POA: Diagnosis not present

## 2022-09-21 DIAGNOSIS — I10 Essential (primary) hypertension: Secondary | ICD-10-CM | POA: Diagnosis not present

## 2022-09-21 DIAGNOSIS — Z6827 Body mass index (BMI) 27.0-27.9, adult: Secondary | ICD-10-CM | POA: Diagnosis not present

## 2022-12-27 DIAGNOSIS — Z853 Personal history of malignant neoplasm of breast: Secondary | ICD-10-CM | POA: Diagnosis not present

## 2022-12-27 DIAGNOSIS — R928 Other abnormal and inconclusive findings on diagnostic imaging of breast: Secondary | ICD-10-CM | POA: Diagnosis not present

## 2022-12-27 DIAGNOSIS — R92323 Mammographic fibroglandular density, bilateral breasts: Secondary | ICD-10-CM | POA: Diagnosis not present

## 2022-12-27 LAB — HM MAMMOGRAPHY

## 2022-12-29 DIAGNOSIS — D0511 Intraductal carcinoma in situ of right breast: Secondary | ICD-10-CM | POA: Diagnosis not present

## 2023-03-20 DIAGNOSIS — E7800 Pure hypercholesterolemia, unspecified: Secondary | ICD-10-CM | POA: Diagnosis not present

## 2023-03-20 DIAGNOSIS — I10 Essential (primary) hypertension: Secondary | ICD-10-CM | POA: Diagnosis not present

## 2023-03-20 DIAGNOSIS — Z23 Encounter for immunization: Secondary | ICD-10-CM | POA: Diagnosis not present

## 2023-03-20 DIAGNOSIS — E039 Hypothyroidism, unspecified: Secondary | ICD-10-CM | POA: Diagnosis not present

## 2023-03-20 DIAGNOSIS — Z6826 Body mass index (BMI) 26.0-26.9, adult: Secondary | ICD-10-CM | POA: Diagnosis not present

## 2023-03-26 ENCOUNTER — Other Ambulatory Visit: Payer: Self-pay | Admitting: Oncology

## 2023-03-26 DIAGNOSIS — D0511 Intraductal carcinoma in situ of right breast: Secondary | ICD-10-CM

## 2023-04-12 ENCOUNTER — Ambulatory Visit: Payer: Medicare Other | Admitting: Oncology

## 2023-05-01 DIAGNOSIS — M818 Other osteoporosis without current pathological fracture: Secondary | ICD-10-CM | POA: Diagnosis not present

## 2023-05-10 DIAGNOSIS — H0288B Meibomian gland dysfunction left eye, upper and lower eyelids: Secondary | ICD-10-CM | POA: Diagnosis not present

## 2023-05-10 DIAGNOSIS — H0288A Meibomian gland dysfunction right eye, upper and lower eyelids: Secondary | ICD-10-CM | POA: Diagnosis not present

## 2023-05-18 ENCOUNTER — Encounter: Payer: Self-pay | Admitting: Oncology

## 2023-05-18 ENCOUNTER — Inpatient Hospital Stay: Payer: Medicare Other | Attending: Oncology | Admitting: Oncology

## 2023-05-18 VITALS — BP 155/76 | HR 64 | Temp 98.0°F | Resp 16 | Ht 66.0 in | Wt 158.8 lb

## 2023-05-18 DIAGNOSIS — D0512 Intraductal carcinoma in situ of left breast: Secondary | ICD-10-CM | POA: Insufficient documentation

## 2023-05-18 DIAGNOSIS — Z170 Estrogen receptor positive status [ER+]: Secondary | ICD-10-CM | POA: Diagnosis not present

## 2023-05-18 DIAGNOSIS — C50911 Malignant neoplasm of unspecified site of right female breast: Secondary | ICD-10-CM | POA: Diagnosis not present

## 2023-05-18 DIAGNOSIS — M8589 Other specified disorders of bone density and structure, multiple sites: Secondary | ICD-10-CM | POA: Diagnosis not present

## 2023-05-18 DIAGNOSIS — Z79899 Other long term (current) drug therapy: Secondary | ICD-10-CM | POA: Insufficient documentation

## 2023-05-18 DIAGNOSIS — Z79810 Long term (current) use of selective estrogen receptor modulators (SERMs): Secondary | ICD-10-CM | POA: Diagnosis not present

## 2023-05-18 NOTE — Progress Notes (Signed)
[facility]  [address] [place],  [place]  [address] [phone]  Clinic Day:  05/18/23  Referring physician: Alinda Deem, MD   ASSESSMENT & PLAN:   Right breast cancer stage 1A diagnosed in 2007.  Estrogen and progesterone receptors were positive. She underwent chemotherapy, radiation, right lumpectomy and 5 years of Tamoxifen.  2.   Left breast DCIS stage 0 in 2021.  She is on chemoprevention with raloxifene.  No evidence of disease recurrence.  She will have repeat annual bilateral mammograms in May with Dr. Lequita Halt.  3.   Osteopenia. This is being treated by Dr. Belva Crome with Prolia injections.  Her last bone density scan in May 2023 shows definite improvement.   Plan:  She continues Raloxifene 60mg  without difficulty and started this in Spring, 2021. 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 She had her annual diagnostic mammogram done on 12/27/2022 that was clear.  Her DEXA scan in May 2023 which showed improvement from osteoporosis to osteopenia in the spine and improvement of pre-existing osteopenia in the femur. She is currently receiving Prolia injections every 6 months. I will see her back in 1 year for reevaluation.  I discussed the assessment and treatment plan with the patient.  The patient was provided an opportunity to ask questions and all were answered.  The patient agreed with the plan and demonstrated an understanding of the instructions.  The patient was advised to call back if the symptoms arise relating to her breast cancer.  I provided 12 minutes of face-to-face time during this this encounter and > 50% was spent counseling as documented under my assessment and plan.   No orders of the defined types were placed in this encounter.    Dellia Beckwith, MD [facility] AT [facility] [address] [place]  [address] Dept: [phone] Dept Fax: [phone]    I have reviewed this report as typed by the medical scribe, and it is complete and accurate.  CHIEF COMPLAINT:  CC: History of right breast cancer stage 1A diagnosed in 2007 and left breast DCIS stage 0 in 2021 s/p lumpectomy  Current Treatment:  Surveillance  HISTORY OF PRESENT ILLNESS:  She has a history of right breast cancer infiltrating ductal grade 2 stage 1A diagnosed in 2007. Estrogen and progesterone receptors were positive. She underwent chemotherapy, radiation, right lumpectomy and 5 years of Tamoxifen. She then had left breast DCIS stage 0 diagnosed in April 2021 which required only lumpectomy. Post lumpectomy scans showed complete resection with clear margins. She was placed on chemoprevention with Raloxifene. She has a diagnosis of osteoporosis and is on therapy for this. She was switched from oral therapy to Prolia injections by Dr. Belva Crome. She sees her surgeon Dr. Lequita Halt yearly in May and he orders her annual mammograms.  INTERVAL HISTORY:  Heidi Barnes is seen in the clinic for follow up for her history of right breast cancer stage 1A diagnosed in 2007 and left breast DCIS stage 0 diagnosed in 2021 s/p lumpectomy. Patient states that she feels well and has no complaints of pain. She continues Raloxifene 60mg  without difficulty and started this in Spring, 2021. 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 She had her annual mammogram in Spring of this year.  Her DEXA scan in May 2023 which  showed improvement from osteoporosis to osteopenia in the spine and improvement of pre-existing osteopenia in the femur. She is currently receiving Prolia injections every 6 months. I will see her back in 1 year for reevaluation.   She denies signs of infection such as sore throat, sinus drainage, cough, or urinary symptoms.  She denies fevers or recurrent chills. She denies pain. She denies nausea,  vomiting, chest pain, dyspnea or cough. Her appetite is great and her weight has decreased 10 pounds over last year .  REVIEW OF SYSTEMS:  Review of Systems  Constitutional: Negative.  Negative for appetite change, chills, diaphoresis, fatigue, fever and unexpected weight change.  HENT:  Negative.  Negative for hearing loss, lump/mass, mouth sores, nosebleeds, sore throat, tinnitus, trouble swallowing and voice change.   Eyes: Negative.  Negative for eye problems and icterus.  Respiratory: Negative.  Negative for chest tightness, cough, hemoptysis, shortness of breath and wheezing.   Cardiovascular: Negative.  Negative for chest pain, leg swelling and palpitations.  Gastrointestinal: Negative.  Negative for abdominal distention, abdominal pain, blood in stool, constipation, diarrhea, nausea, rectal pain and vomiting.  Endocrine: Negative.   Genitourinary: Negative.  Negative for bladder incontinence, difficulty urinating, dyspareunia, dysuria, frequency, hematuria, menstrual problem, nocturia, pelvic pain, vaginal bleeding and vaginal discharge.   Musculoskeletal: Negative.  Negative for arthralgias, back pain, flank pain, gait problem, myalgias, neck pain and neck stiffness.  Skin: Negative.  Negative for itching, rash and wound.  Neurological:  Negative for dizziness, extremity weakness, gait problem, headaches, light-headedness, numbness, seizures and speech difficulty.  Hematological: Negative.  Negative for adenopathy. Does not bruise/bleed easily.  Psychiatric/Behavioral: Negative.  Negative for confusion, decreased concentration, depression, sleep disturbance and suicidal ideas. The patient is not nervous/anxious.       VITALS:  Blood pressure (!) 155/76, pulse 64, temperature 98 F (36.7 C), temperature source Oral, resp. rate 16, height 5\' 6"  (1.676 m), weight 158 lb 12.8 oz (72 kg), SpO2 100%.  Wt Readings from Last 3 Encounters:  05/18/23 158 lb 12.8 oz (72 kg)  04/14/22 168 lb  14.4 oz (76.6 kg)  06/28/20 167 lb 12.8 oz (76.1 kg)    Body mass index is 25.63 kg/m.  Performance status (ECOG): 0 - Asymptomatic  PHYSICAL EXAM:   Physical Exam Exam conducted with a chaperone present.  Constitutional:      General: She is not in acute distress.    Appearance: Normal appearance. She is normal weight.  HENT:     Head: Normocephalic and atraumatic.  Eyes:     General: No scleral icterus.    Extraocular Movements: Extraocular movements intact.     Conjunctiva/sclera: Conjunctivae normal.     Pupils: Pupils are equal, round, and reactive to light.  Cardiovascular:     Rate and Rhythm: Normal rate and regular rhythm.     Pulses: Normal pulses.     Heart sounds: Normal heart sounds. No murmur heard.    No friction rub. No gallop.  Pulmonary:     Effort: Pulmonary effort is normal. No respiratory distress.     Breath sounds: Normal breath sounds.  Chest:     Comments: Well healed scar in the right breast at about 12 o'clock Well healed scar the lateral right breast at 9 o'clock. Faded scar in the superior left breast at 12 o'clock Bilateral implants in place No masses in either breast Abdominal:     General: Bowel sounds are normal. There is no distension.     Palpations:  Abdomen is soft. There is no hepatomegaly, splenomegaly or mass.     Tenderness: There is no abdominal tenderness.  Musculoskeletal:        General: Normal range of motion.     Cervical back: Normal range of motion and neck supple.     Right lower leg: No edema.     Left lower leg: No edema.  Lymphadenopathy:     Cervical: No cervical adenopathy.     Upper Body:     Right upper body: No supraclavicular, axillary or pectoral adenopathy.     Left upper body: No supraclavicular, axillary or pectoral adenopathy.  Skin:    General: Skin is warm and dry.  Neurological:     General: No focal deficit present.     Mental Status: She is alert and oriented to person, place, and time. Mental  status is at baseline.  Psychiatric:        Mood and Affect: Mood normal.        Behavior: Behavior normal.        Thought Content: Thought content normal.        Judgment: Judgment normal.      LABS:      Latest Ref Rng & Units 07/19/2007    7:47 AM 06/20/2007    2:25 PM 12/21/2006    2:50 PM  CBC  WBC 3.9 - 10.0 10e3/uL  3.8  4.8   Hemoglobin  14.3  12.4  12.7   Hematocrit  42.0  36.1  36.3   Platelets 145 - 400 10e3/uL  156  157       Latest Ref Rng & Units 07/19/2007    7:47 AM 06/20/2007    2:25 PM 12/21/2006    2:50 PM  CMP  Glucose  96  105  106   BUN 6 - 23 mg/dL  12  12   Creatinine 8.29 - 1.20 mg/dL  5.62  1.30   Sodium  865  140  143   Potassium  4.0  4.3  3.9   Chloride 96 - 112 mEq/L  103  107   CO2 19 - 32 mEq/L  28  27   Calcium 8.4 - 10.5 mg/dL  9.2  9.7   Total Protein 6.0 - 8.3 g/dL  6.9  7.1   Total Bilirubin 0.3 - 1.2 mg/dL  0.4  0.6   Alkaline Phos 39 - 117 U/L  80  73   AST 0 - 37 U/L  22  25   ALT 0 - 35 U/L  19  23      No results found for: "CEA1", "CEA" / No results found for: "CEA1", "CEA" No results found for: "PSA1" No results found for: "HQI696" No results found for: "CAN125"  No results found for: "TOTALPROTELP", "ALBUMINELP", "A1GS", "A2GS", "BETS", "BETA2SER", "GAMS", "MSPIKE", "SPEI" No results found for: "TIBC", "FERRITIN", "IRONPCTSAT" Lab Results  Component Value Date   LDH 149 06/20/2007   LDH 159 10/04/2006   LDH 177 02/20/2006     STUDIES:  Exam: 12/27/2022 Digital Diagnostic Bilateral Mammogram with Tomosynthesis and CAD Impression: No evidence of breast malignancy  EXAM: 10/20/21 DUAL X-RAY ABSORPTIOMETRY (DXA) FOR BONE MINERAL DENSITY  IMPRESSION:  Cattaleya Endo completed a BMD test on 10/20/2021 using the Lunar iDXA  DXA System (analysis version: 13.60) manufactured by [employer].  The following summarizes the results of our evaluation.  TECHNOLOGIST: Mercer Pod  PATIENT BIOGRAPHICAL:  Name: MARGRIE, BALLARD   Patient ID: [id number] [facility]  Birth Date: 14-Aug-1950 Height: 65.0 in.  Gender: Female Exam Date: 10/20/2021 Weight: 167.0 lbs.  Indications:  M85.89 History of Fracture (Adult) Fractures: RIGHT FOOT Treatments:  ASSESSMENT:  The BMD measured at Femur Neck Right is 0.736 g/cm2 with a T-score  of -2.2. This patient is considered osteopenic according to World  Health Organization [facility]) criteria.  The scan quality is good. Exclusions: L3 was excluded due to  degenerative changes.  Patient does not meet criteria for FRAX assessment- Patient  currently taking Evista.  Site Region Measured Measured WHO Young Adult BMD  Date Age Classification T-score  AP Spine L1-L4 (L3) 10/20/2021 72.7 Osteopenia -1.9 0.946 g/cm2  AP Spine L1-L4 (L3) 06/17/2018 69.3 Osteoporosis -2.6 0.859 g/cm2  AP Spine L1-L4 (L3) 10/28/2014 65.7 Osteoporosis -2.7 0.851 g/cm2  DualFemur Neck Right 10/20/2021 72.7 Osteopenia -2.2 0.736 g/cm2  DualFemur Neck Right 06/17/2018 69.3 Osteopenia -2.4 0.705 g/cm2  DualFemur Neck Right 10/28/2014 65.7 Osteoporosis -2.6 0.670 g/cm2  DualFemur Total Mean 10/20/2021 72.7 Osteopenia -1.7 0.800 g/cm2  DualFemur Total Mean 06/17/2018 69.3 Osteopenia -1.8 0.777 g/cm2  DualFemur Total Mean 10/28/2014 65.7 Osteopenia -1.8 0.776 g/cm2  World Health Organization [facility]) criteria for post-menopausal Caucasian Women:  Normal: T-score at or above -1 SD  Osteopenia: T-score between -1 and -2.5 SD  Osteoporosis: T-score at or below -2.5 SD   RECOMMENDATIONS:  1. All patients should optimize calcium and vitamin D intake.  2. Consider FDA- approved medical therapies in post menopausal women  and men aged 62 years and older, based on the following:  a. A hip or vertebral (clinical or morphometric) fracture.  b. T- score < 2.5 of the femoral neck or spine after appropriate  evaluation to exclude secondary causes.  c. Low bone mass (T- score between -1.0 and -2.5 at femoral neck or  spine) and a 10 -year probability  of a hip  fracture > 3% or a 10 -year probability of a major osteoporosis-  related fracture > 20% based on the [place]- adapted  WHO algorithm.  d. Clinician judgement and/ or patient preferences may indicate  treatment for people with 10- year fracture probabilities above or  below these levels.  FOLLOW-UP:  People with diagnosed cases of osteoporosis or at high risk for  fracture should have regular bone mineral density tests. For  patients eligible for Medicare, routine testing is allowed once  every 2 years. The testing frequency can be increased to one year  for patients who have rapidly progressing disease, those who are  receiving medical therapy to restore bone mass, or have additional  risk factors.   I have reviewed this report and agree with the above findings.  Electronically Signed  By: Bary Richard M.D.  On: 10/20/2021 11:46    Allergies: No Known Allergies  Current Medications: Current Outpatient Medications  Medication Sig Dispense Refill   rosuvastatin (CRESTOR) 10 MG tablet Take 10 mg by mouth 2 (two) times a week.     Cholecalciferol (VITAMIN D3) 50 MCG (2000 UT) CAPS Take by mouth.     lisinopril (ZESTRIL) 5 MG tablet Take 5 mg by mouth daily.     raloxifene (EVISTA) 60 MG tablet TAKE ONE TABLET BY MOUTH ONCE DAILY 90 tablet 3   SYNTHROID 75 MCG tablet Take by mouth. 0.5 tablet Mon & Fri and whole tablet the rest of the week     vitamin C (ASCORBIC ACID) 500 MG tablet Take 500 mg by mouth daily.     zinc gluconate 50  MG tablet Take 50 mg by mouth daily.     No current facility-administered medications for this visit.    I,Jasmine M Lassiter,acting as a scribe for Dellia Beckwith, MD.,have documented all relevant documentation on the behalf of Dellia Beckwith, MD,as directed by  Dellia Beckwith, MD while in the presence of Dellia Beckwith, MD.

## 2023-05-25 DIAGNOSIS — H0288A Meibomian gland dysfunction right eye, upper and lower eyelids: Secondary | ICD-10-CM | POA: Diagnosis not present

## 2023-05-25 DIAGNOSIS — H0288B Meibomian gland dysfunction left eye, upper and lower eyelids: Secondary | ICD-10-CM | POA: Diagnosis not present

## 2023-06-02 ENCOUNTER — Ambulatory Visit (HOSPITAL_BASED_OUTPATIENT_CLINIC_OR_DEPARTMENT_OTHER)
Admission: RE | Admit: 2023-06-02 | Discharge: 2023-06-02 | Disposition: A | Discharge status: Home / Self Care | Payer: Medicare Other | Source: Ambulatory Visit | Attending: Internal Medicine | Admitting: Internal Medicine

## 2023-06-02 ENCOUNTER — Encounter (HOSPITAL_BASED_OUTPATIENT_CLINIC_OR_DEPARTMENT_OTHER): Payer: Self-pay

## 2023-06-02 VITALS — BP 159/80 | HR 83 | Temp 98.6°F | Resp 20 | Wt 152.0 lb

## 2023-06-02 DIAGNOSIS — R059 Cough, unspecified: Secondary | ICD-10-CM | POA: Diagnosis present

## 2023-06-02 DIAGNOSIS — J069 Acute upper respiratory infection, unspecified: Secondary | ICD-10-CM | POA: Diagnosis not present

## 2023-06-02 DIAGNOSIS — R0789 Other chest pain: Secondary | ICD-10-CM | POA: Diagnosis present

## 2023-06-02 DIAGNOSIS — R519 Headache, unspecified: Secondary | ICD-10-CM | POA: Diagnosis present

## 2023-06-02 MED ORDER — PROMETHAZINE-DM 6.25-15 MG/5ML PO SYRP: 5.0000 mL | ORAL_SOLUTION | Freq: Every evening | ORAL | 0 refills | Status: AC | PRN

## 2023-06-02 MED ORDER — GUAIFENESIN ER 1200 MG PO TB12: 1200.0000 mg | ORAL_TABLET | Freq: Two times a day (BID) | ORAL | 0 refills | Status: AC

## 2023-06-02 NOTE — ED Provider Notes (Signed)
 [facility]    CSN: [id number] Arrival date & time: 06/02/23  0940      History   Chief Complaint Chief Complaint  Patient presents with   Nasal Congestion    Possibly flu - Entered by patient    HPI Heidi Barnes is a 73 y.o. female.   Patient presents to urgent care for evaluation of cough, nasal congestion, sore throat, body aches, and generalized fatigue that started on Thursday, May 30, 2022 (2 days ago).  Cough is mostly dry and nonproductive.  She had a little bit of a headache and felt as though her head was full so she took approximately 3-4 doses of Augmentin from a previous prescription for sinusitis without relief.  Denies chest pain, shortness of breath, palpitations, nausea, vomiting, diarrhea, abdominal pain, rash, and fever/chills.  No recent sick contacts with similar symptoms.  Never smoker, denies drug use.  Denies history of chronic respiratory problems.  Taking other over-the-counter medications such as Sudafed without much relief.     History reviewed. No pertinent past medical history.  Patient Active Problem List   Diagnosis Date Noted   Breast cancer (HCC) 05/03/2022   Breast neoplasm, Tis (DCIS), right 05/03/2022   Osteoporosis 05/03/2022    History reviewed. No pertinent surgical history.  OB History   No obstetric history on file.      Home Medications    Prior to Admission medications   Medication Sig Start Date End Date Taking? Authorizing Provider  Guaifenesin  1200 MG TB12 Take 1 tablet (1,200 mg total) by mouth in the morning and at bedtime. 06/02/23  Yes Enedelia Dorna HERO, FNP  promethazine -dextromethorphan (PROMETHAZINE -DM) 6.25-15 MG/5ML syrup Take 5 mLs by mouth at bedtime as needed for cough. 06/02/23  Yes Enedelia Dorna HERO, FNP  Cholecalciferol (VITAMIN D3) 50 MCG (2000 UT) CAPS Take by mouth.    [provider]  lisinopril (ZESTRIL) 5 MG tablet Take 5 mg by mouth daily. 04/23/20   [provider]  raloxifene  (EVISTA ) 60 MG tablet TAKE ONE TABLET BY MOUTH ONCE DAILY 03/27/23   Cornelius Wanda DEL, MD  rosuvastatin (CRESTOR) 10 MG tablet Take 10 mg by mouth 2 (two) times a week. 02/14/23   [provider]  SYNTHROID 75 MCG tablet Take by mouth. 0.5 tablet Mon & Fri and whole tablet the rest of the week 03/18/20   [provider]  vitamin C (ASCORBIC ACID) 500 MG tablet Take 500 mg by mouth daily.    [provider]  zinc gluconate 50 MG tablet Take 50 mg by mouth daily.    [provider]    Family History History reviewed. No pertinent family history.  Social History Social History   Tobacco Use   Smoking status: Never   Smokeless tobacco: Never  Vaping Use   Vaping status: Never Used  Substance Use Topics   Alcohol  use: Never   Drug use: Never     Allergies   Patient has no known allergies.   Review of Systems Review of Systems Per HPI  Physical Exam Triage Vital Signs ED Triage Vitals [06/02/23 1022]  Encounter Vitals Group     BP (!) 159/80     Systolic BP Percentile      Diastolic BP Percentile      Pulse Rate 83     Resp 20     Temp 98.6 F (37 C)     Temp Source Oral     SpO2  Weight 152 lb (68.9 kg)     Height      Head Circumference      Peak Flow      Pain Score 5     Pain Loc      Pain Education      Exclude from Growth Chart    No data found.  Updated Vital Signs BP (!) 159/80 (BP Location: Right Arm)   Pulse 83   Temp 98.6 F (37 C) (Oral)   Resp 20   Wt 152 lb (68.9 kg)   BMI 24.53 kg/m   Visual Acuity Right Eye Distance:   Left Eye Distance:   Bilateral Distance:    Right Eye Near:   Left Eye Near:    Bilateral Near:     Physical Exam Vitals and nursing note reviewed.  Constitutional:      Appearance: She is not ill-appearing or toxic-appearing.  HENT:     Head: Normocephalic and atraumatic.     Right Ear: Hearing, tympanic membrane, ear canal and external ear normal.      Left Ear: Hearing, tympanic membrane, ear canal and external ear normal.     Nose: Congestion present.     Mouth/Throat:     Lips: Pink.     Mouth: Mucous membranes are moist. No injury or oral lesions.     Dentition: Normal dentition.     Tongue: No lesions.     Pharynx: Oropharynx is clear. Uvula midline. Posterior oropharyngeal erythema present. No pharyngeal swelling, oropharyngeal exudate, uvula swelling or postnasal drip.     Tonsils: No tonsillar exudate.     Comments: Mild erythema to posterior oropharynx with small amount of clear postnasal drainage visualized.  Eyes:     General: Lids are normal. Vision grossly intact. Gaze aligned appropriately.     Extraocular Movements: Extraocular movements intact.     Conjunctiva/sclera: Conjunctivae normal.  Neck:     Trachea: Trachea and phonation normal.  Cardiovascular:     Rate and Rhythm: Normal rate and regular rhythm.     Heart sounds: Normal heart sounds, S1 normal and S2 normal.  Pulmonary:     Effort: Pulmonary effort is normal. No respiratory distress.     Breath sounds: Normal breath sounds and air entry. No wheezing, rhonchi or rales.  Chest:     Chest wall: No tenderness.  Musculoskeletal:     Cervical back: Neck supple.  Lymphadenopathy:     Cervical: No cervical adenopathy.  Skin:    General: Skin is warm and dry.     Capillary Refill: Capillary refill takes less than 2 seconds.     Findings: No rash.  Neurological:     General: No focal deficit present.     Mental Status: She is alert and oriented to person, place, and time. Mental status is at baseline.     Cranial Nerves: No dysarthria or facial asymmetry.  Psychiatric:        Mood and Affect: Mood normal.        Speech: Speech normal.        Behavior: Behavior normal.        Thought Content: Thought content normal.        Judgment: Judgment normal.      UC Treatments / Results  Labs (all labs ordered are listed, but only abnormal results are  displayed) Labs Reviewed  SARS CORONAVIRUS 2 (TAT 6-24 HRS)    EKG   Radiology No results found.  Procedures Procedures (  including critical care time)  Medications Ordered in UC Medications - No data to display  Initial Impression / Assessment and Plan / UC Course  I have reviewed the triage vital signs and the nursing notes.  Pertinent labs & imaging results that were available during my care of the patient were reviewed by me and considered in my medical decision making (see chart for details).   1.  Viral URI with cough Suspect viral URI, viral syndrome.  Strep/viral testing: COVID testing pending, she may have antiviral if positive.  Physical exam findings reassuring, vital signs hemodynamically stable, and lungs clear, therefore deferred imaging of the chest.  Advised supportive care/prescriptions for symptomatic relief as outlined in AVS.   Counseled patient on potential for adverse effects with medications prescribed/recommended today, strict ER and return-to-clinic precautions discussed, patient verbalized understanding.    Final Clinical Impressions(s) / UC Diagnoses   Final diagnoses:  Viral URI with cough     Discharge Instructions      You have a viral illness which will improve on its own with rest, fluids, and medications to help with your symptoms.  Tylenol, guaifenesin  (plain mucinex ), and saline nasal sprays may help relieve symptoms.   Two teaspoons of honey in 1 cup of warm water every 4-6 hours may help with throat pains.  Humidifier in room at nighttime may help soothe cough (clean well daily).   For chest pain, shortness of breath, inability to keep food or fluids down without vomiting, fever that does not respond to tylenol or motrin, or any other severe symptoms, please go to the ER for further evaluation. Return to urgent care as needed, otherwise follow-up with PCP.     ED Prescriptions     Medication Sig Dispense Auth. Provider    Guaifenesin  1200 MG TB12 Take 1 tablet (1,200 mg total) by mouth in the morning and at bedtime. 14 tablet Enedelia Dorna HERO, FNP   promethazine -dextromethorphan (PROMETHAZINE -DM) 6.25-15 MG/5ML syrup Take 5 mLs by mouth at bedtime as needed for cough. 118 mL Enedelia Dorna HERO, FNP      PDMP not reviewed this encounter.   Enedelia Dorna HERO, FNP 06/02/23 1051

## 2023-06-02 NOTE — ED Triage Notes (Signed)
 Nasal congestion, cough onset Wednesday. States feels as if it's more in her chest. Patient  took some left over Augmentin from a previous sinus infection.

## 2023-06-02 NOTE — Discharge Instructions (Signed)

## 2023-06-03 LAB — SARS CORONAVIRUS 2 (TAT 6-24 HRS): SARS Coronavirus 2: NEGATIVE

## 2023-06-05 ENCOUNTER — Telehealth (HOSPITAL_COMMUNITY): Payer: Self-pay

## 2023-06-07 DIAGNOSIS — J0100 Acute maxillary sinusitis, unspecified: Secondary | ICD-10-CM | POA: Diagnosis not present

## 2023-06-11 ENCOUNTER — Encounter: Payer: Self-pay | Admitting: Surgery

## 2023-06-14 DIAGNOSIS — J22 Unspecified acute lower respiratory infection: Secondary | ICD-10-CM | POA: Diagnosis not present

## 2023-07-02 DIAGNOSIS — Z961 Presence of intraocular lens: Secondary | ICD-10-CM | POA: Diagnosis not present

## 2023-09-13 DIAGNOSIS — J069 Acute upper respiratory infection, unspecified: Secondary | ICD-10-CM | POA: Diagnosis not present

## 2023-09-18 DIAGNOSIS — E038 Other specified hypothyroidism: Secondary | ICD-10-CM | POA: Diagnosis not present

## 2023-09-18 DIAGNOSIS — E7800 Pure hypercholesterolemia, unspecified: Secondary | ICD-10-CM | POA: Diagnosis not present

## 2023-09-18 DIAGNOSIS — Z79899 Other long term (current) drug therapy: Secondary | ICD-10-CM | POA: Diagnosis not present

## 2023-09-18 DIAGNOSIS — I10 Essential (primary) hypertension: Secondary | ICD-10-CM | POA: Diagnosis not present

## 2023-09-18 DIAGNOSIS — C50511 Malignant neoplasm of lower-outer quadrant of right female breast: Secondary | ICD-10-CM | POA: Diagnosis not present

## 2023-09-18 DIAGNOSIS — E663 Overweight: Secondary | ICD-10-CM | POA: Diagnosis not present

## 2023-09-18 DIAGNOSIS — Z6827 Body mass index (BMI) 27.0-27.9, adult: Secondary | ICD-10-CM | POA: Diagnosis not present

## 2023-09-18 DIAGNOSIS — E039 Hypothyroidism, unspecified: Secondary | ICD-10-CM | POA: Diagnosis not present

## 2023-10-16 DIAGNOSIS — Z08 Encounter for follow-up examination after completed treatment for malignant neoplasm: Secondary | ICD-10-CM | POA: Diagnosis not present

## 2023-10-16 DIAGNOSIS — I788 Other diseases of capillaries: Secondary | ICD-10-CM | POA: Diagnosis not present

## 2023-10-16 DIAGNOSIS — D485 Neoplasm of uncertain behavior of skin: Secondary | ICD-10-CM | POA: Diagnosis not present

## 2023-10-16 DIAGNOSIS — L821 Other seborrheic keratosis: Secondary | ICD-10-CM | POA: Diagnosis not present

## 2023-10-16 DIAGNOSIS — D225 Melanocytic nevi of trunk: Secondary | ICD-10-CM | POA: Diagnosis not present

## 2023-10-16 DIAGNOSIS — L538 Other specified erythematous conditions: Secondary | ICD-10-CM | POA: Diagnosis not present

## 2023-10-16 DIAGNOSIS — Z85828 Personal history of other malignant neoplasm of skin: Secondary | ICD-10-CM | POA: Diagnosis not present

## 2023-10-16 DIAGNOSIS — L820 Inflamed seborrheic keratosis: Secondary | ICD-10-CM | POA: Diagnosis not present

## 2023-10-30 DIAGNOSIS — Z6826 Body mass index (BMI) 26.0-26.9, adult: Secondary | ICD-10-CM | POA: Diagnosis not present

## 2023-10-30 DIAGNOSIS — Z0000 Encounter for general adult medical examination without abnormal findings: Secondary | ICD-10-CM | POA: Diagnosis not present

## 2023-10-30 DIAGNOSIS — Z1389 Encounter for screening for other disorder: Secondary | ICD-10-CM | POA: Diagnosis not present

## 2023-11-01 DIAGNOSIS — M818 Other osteoporosis without current pathological fracture: Secondary | ICD-10-CM | POA: Diagnosis not present

## 2023-11-15 DIAGNOSIS — J0100 Acute maxillary sinusitis, unspecified: Secondary | ICD-10-CM | POA: Diagnosis not present

## 2023-11-19 DIAGNOSIS — J069 Acute upper respiratory infection, unspecified: Secondary | ICD-10-CM | POA: Diagnosis not present

## 2023-11-19 DIAGNOSIS — R051 Acute cough: Secondary | ICD-10-CM | POA: Diagnosis not present

## 2023-11-19 DIAGNOSIS — R059 Cough, unspecified: Secondary | ICD-10-CM | POA: Diagnosis not present

## 2024-02-15 DIAGNOSIS — M8589 Other specified disorders of bone density and structure, multiple sites: Secondary | ICD-10-CM | POA: Diagnosis not present

## 2024-02-15 DIAGNOSIS — R921 Mammographic calcification found on diagnostic imaging of breast: Secondary | ICD-10-CM | POA: Diagnosis not present

## 2024-02-15 DIAGNOSIS — R92323 Mammographic fibroglandular density, bilateral breasts: Secondary | ICD-10-CM | POA: Diagnosis not present

## 2024-02-15 DIAGNOSIS — Z1231 Encounter for screening mammogram for malignant neoplasm of breast: Secondary | ICD-10-CM | POA: Diagnosis not present

## 2024-02-15 LAB — HM MAMMOGRAPHY

## 2024-02-15 LAB — HM DEXA SCAN

## 2024-02-18 DIAGNOSIS — M8589 Other specified disorders of bone density and structure, multiple sites: Secondary | ICD-10-CM | POA: Diagnosis not present

## 2024-02-19 DIAGNOSIS — D0511 Intraductal carcinoma in situ of right breast: Secondary | ICD-10-CM | POA: Diagnosis not present

## 2024-03-11 ENCOUNTER — Other Ambulatory Visit: Payer: Self-pay | Admitting: Oncology

## 2024-03-11 DIAGNOSIS — D0511 Intraductal carcinoma in situ of right breast: Secondary | ICD-10-CM

## 2024-03-19 DIAGNOSIS — F320 Major depressive disorder, single episode, mild: Secondary | ICD-10-CM | POA: Diagnosis not present

## 2024-03-19 DIAGNOSIS — E7800 Pure hypercholesterolemia, unspecified: Secondary | ICD-10-CM | POA: Diagnosis not present

## 2024-03-19 DIAGNOSIS — Z6827 Body mass index (BMI) 27.0-27.9, adult: Secondary | ICD-10-CM | POA: Diagnosis not present

## 2024-03-19 DIAGNOSIS — Z23 Encounter for immunization: Secondary | ICD-10-CM | POA: Diagnosis not present

## 2024-03-19 DIAGNOSIS — C50511 Malignant neoplasm of lower-outer quadrant of right female breast: Secondary | ICD-10-CM | POA: Diagnosis not present

## 2024-03-19 DIAGNOSIS — M818 Other osteoporosis without current pathological fracture: Secondary | ICD-10-CM | POA: Diagnosis not present

## 2024-03-19 DIAGNOSIS — I10 Essential (primary) hypertension: Secondary | ICD-10-CM | POA: Diagnosis not present

## 2024-03-19 DIAGNOSIS — E039 Hypothyroidism, unspecified: Secondary | ICD-10-CM | POA: Diagnosis not present

## 2024-03-19 DIAGNOSIS — E663 Overweight: Secondary | ICD-10-CM | POA: Diagnosis not present

## 2024-04-16 DIAGNOSIS — F320 Major depressive disorder, single episode, mild: Secondary | ICD-10-CM | POA: Diagnosis not present

## 2024-04-16 DIAGNOSIS — I10 Essential (primary) hypertension: Secondary | ICD-10-CM | POA: Diagnosis not present

## 2024-04-17 DIAGNOSIS — Z08 Encounter for follow-up examination after completed treatment for malignant neoplasm: Secondary | ICD-10-CM | POA: Diagnosis not present

## 2024-04-17 DIAGNOSIS — S90912A Unspecified superficial injury of left ankle, initial encounter: Secondary | ICD-10-CM | POA: Diagnosis not present

## 2024-04-17 DIAGNOSIS — Z85828 Personal history of other malignant neoplasm of skin: Secondary | ICD-10-CM | POA: Diagnosis not present

## 2024-05-16 ENCOUNTER — Inpatient Hospital Stay: Payer: Medicare Other | Attending: Oncology | Admitting: Oncology

## 2024-05-16 ENCOUNTER — Telehealth: Payer: Self-pay | Admitting: Oncology

## 2024-05-16 VITALS — BP 142/71 | HR 63 | Temp 97.8°F | Resp 16 | Ht 66.0 in | Wt 161.1 lb

## 2024-05-16 DIAGNOSIS — Z79811 Long term (current) use of aromatase inhibitors: Secondary | ICD-10-CM

## 2024-05-16 DIAGNOSIS — Z170 Estrogen receptor positive status [ER+]: Secondary | ICD-10-CM | POA: Diagnosis not present

## 2024-05-16 DIAGNOSIS — D0511 Intraductal carcinoma in situ of right breast: Secondary | ICD-10-CM

## 2024-05-16 DIAGNOSIS — D0512 Intraductal carcinoma in situ of left breast: Secondary | ICD-10-CM | POA: Diagnosis present

## 2024-05-16 DIAGNOSIS — M810 Age-related osteoporosis without current pathological fracture: Secondary | ICD-10-CM | POA: Diagnosis not present

## 2024-05-16 DIAGNOSIS — Z79810 Long term (current) use of selective estrogen receptor modulators (SERMs): Secondary | ICD-10-CM | POA: Diagnosis not present

## 2024-05-16 DIAGNOSIS — Z79899 Other long term (current) drug therapy: Secondary | ICD-10-CM | POA: Diagnosis not present

## 2024-05-16 DIAGNOSIS — C50911 Malignant neoplasm of unspecified site of right female breast: Secondary | ICD-10-CM

## 2024-05-16 NOTE — Telephone Encounter (Signed)
 Patient has been scheduled for follow-up visit per 05/16/2024 LOS.  Pt given an appt calendar with date and time.

## 2024-05-16 NOTE — Progress Notes (Signed)
 " Heidi Barnes  [address] [place], [place] [address] [phone]  Clinic Day:  05/16/2024  Referring physician: Gayl Males, MD   ASSESSMENT & PLAN:   1. Right breast cancer stage 1A diagnosed in 2007.  Estrogen and progesterone receptors were positive. She underwent chemotherapy, radiation, right lumpectomy and 5 years of Tamoxifen.  2.   Left breast DCIS stage 0 in 2021.  She is on chemoprevention with raloxifene .  No evidence of disease recurrence as of 02/15/2024 mammography.  She will have repeat annual bilateral mammograms with Dr. Joesph.  3.   Osteopenia. This is being treated by Dr. Gayl with Prolia  injections.  She had a DEXA scan on 02/15/2024 revealed a T-score of -1.7 for the lumbar spine L1, L2, L4, -1.9 for the left femoral neck, -1.2 for the left total hip (improved 2%), -2.1 for the right femoral neck, and -1.8 for the right total hip (improved 2%).    Plan:  She feels well and has no complaints of pain. She continues Raloxifene  60 mg without difficulty and started this in Spring, 2021, so will complete 5 years in the spring of 2026. She had a DEXA scan on 02/15/2024 revealed a T-score of -1.7 for the lumbar spine L1, L2, L4, -1.9 for the left femoral neck, -1.2 for the left total hip (improved 2%), -2.1 for the right femoral neck, and -1.8 for the right total hip (improved 2%). Her annual mammogram and follow up with Dr. Joesph on 02/15/2024 was negative. She currently takes 10 mg lisinopril daily. She completes routine labs with her PCP and continues Prolia  injections with Dr. Gayl every 6 months. I will see her back in 1 year for reevaluation. I discussed the assessment and treatment plan with the patient.  The patient was provided an opportunity to ask questions and all were answered.  The patient agreed with the plan and demonstrated an understanding of the instructions.  The patient was advised to call back if the symptoms arise relating to her breast  cancer.  I provided 14 minutes of face-to-face time during this this encounter and > 50% was spent counseling as documented under my assessment and plan.   No orders of the defined types were placed in this encounter.   Heidi VEAR Cornish, MD  [facility] - A DEPT OF [facility] [address] [place] [address] Dept: [phone] Dept Fax: [phone]   I have reviewed this report as typed by the medical scribe, and it is complete and accurate.  CHIEF COMPLAINT:  CC: History of right breast cancer stage 1A diagnosed in 2007 and left breast DCIS stage 0 in 2021 s/p lumpectomy  Current Treatment:  Surveillance  HISTORY OF PRESENT ILLNESS:  She has a history of right breast cancer infiltrating ductal grade 2 stage 1A diagnosed in 2007. Estrogen and progesterone receptors were positive. She underwent chemotherapy, radiation, right lumpectomy and 5 years of Tamoxifen. She then had left breast DCIS stage 0 diagnosed in April 2021 which required only lumpectomy. Post lumpectomy scans showed complete resection with clear margins. She was placed on chemoprevention with Raloxifene . She has a diagnosis of osteoporosis and is on therapy for this. She was switched from oral therapy to Prolia  injections by Dr. Gayl. She sees her surgeon Dr. Joesph yearly in May and he orders her annual mammograms.  INTERVAL HISTORY:  Heidi Barnes is seen in the clinic for follow up for her history of  right breast cancer stage 1A diagnosed in 2007 and left breast DCIS stage 0 diagnosed in 2021 s/p lumpectomy. Patient states that she feels well and has no complaints of pain. She continues Raloxifene  60 mg without difficulty and started this in the Spring, 2021, so will complete 5 years in the spring of 2026. She had a DEXA scan on 02/15/2024 revealed a T-score of -1.7 for the lumbar spine L1, L2, L4, -1.9 for the left femoral neck, -1.2 for the left total hip  (improved 2%), -2.1 for the right femoral neck, and -1.8 for the right total hip (improved 2%). Her annual mammogram and follow up with Dr. Joesph on 02/15/2024 was negative. She currently takes 10 mg lisinopril daily. She completes routine labs with her PCP and continues Prolia  injections with Dr. Gayl every 6 months. I will see her back in 1 year for reevaluation.  She denies fever, chills, night sweats, or other signs of infection. She denies cardiorespiratory and gastrointestinal issues. She  denies pain. Her appetite is improving and Her weight has increased 3 pounds over last 1 year.  REVIEW OF SYSTEMS:  Review of Systems  Constitutional: Negative.  Negative for appetite change, chills, diaphoresis, fatigue, fever and unexpected weight change.  HENT:  Negative.  Negative for hearing loss, lump/mass, mouth sores, nosebleeds, sore throat, tinnitus, trouble swallowing and voice change.   Eyes: Negative.  Negative for eye problems and icterus.  Respiratory: Negative.  Negative for chest tightness, cough, hemoptysis, shortness of breath and wheezing.   Cardiovascular: Negative.  Negative for chest pain, leg swelling and palpitations.  Gastrointestinal: Negative.  Negative for abdominal distention, abdominal pain, blood in stool, constipation, diarrhea, nausea, rectal pain and vomiting.  Endocrine: Negative.   Genitourinary: Negative.  Negative for bladder incontinence, difficulty urinating, dyspareunia, dysuria, frequency, hematuria, menstrual problem, nocturia, pelvic pain, vaginal bleeding and vaginal discharge.   Musculoskeletal: Negative.  Negative for arthralgias, back pain, flank pain, gait problem, myalgias, neck pain and neck stiffness.  Skin: Negative.  Negative for itching, rash and wound.  Neurological:  Negative for dizziness, extremity weakness, gait problem, headaches, light-headedness, numbness, seizures and speech difficulty.  Hematological: Negative.  Negative for adenopathy.  Does not bruise/bleed easily.  Psychiatric/Behavioral: Negative.  Negative for confusion, decreased concentration, depression, sleep disturbance and suicidal ideas. The patient is not nervous/anxious.    VITALS:  Blood pressure (!) 142/71, pulse 63, temperature 97.8 F (36.6 C), temperature source Oral, resp. rate 16, height 5' 6 (1.676 m), weight 161 lb 1.6 oz (73.1 kg), SpO2 98%.  Wt Readings from Last 3 Encounters:  05/16/24 161 lb 1.6 oz (73.1 kg)  06/02/23 152 lb (68.9 kg)  05/18/23 158 lb 12.8 oz (72 kg)    Body mass index is 26 kg/m.  Performance status (ECOG): 0 - Asymptomatic  PHYSICAL EXAM:   Physical Exam Vitals and nursing note reviewed.  Constitutional:      General: She is not in acute distress.    Appearance: Normal appearance. She is normal weight.  HENT:     Head: Normocephalic and atraumatic.  Eyes:     General: No scleral icterus.    Extraocular Movements: Extraocular movements intact.     Conjunctiva/sclera: Conjunctivae normal.     Pupils: Pupils are equal, round, and reactive to light.  Cardiovascular:     Rate and Rhythm: Normal rate and regular rhythm.     Pulses: Normal pulses.     Heart sounds: Normal heart sounds. No murmur heard.  No friction rub. No gallop.  Pulmonary:     Effort: Pulmonary effort is normal. No respiratory distress.     Breath sounds: Normal breath sounds.  Chest:     Comments: Well-healed scar in the upper outer quadrant of the right breast with some telangiectasia. Healed scar in the right lower outer quadrant. Well-healed scar in upper outer quadrant of the left breast. No masses in either breast. Abdominal:     General: Bowel sounds are normal. There is no distension.     Palpations: Abdomen is soft. There is no hepatomegaly, splenomegaly or mass.     Tenderness: There is no abdominal tenderness.  Musculoskeletal:        General: Normal range of motion.     Cervical back: Normal range of motion and neck supple.      Right lower leg: No edema.     Left lower leg: No edema.  Lymphadenopathy:     Cervical: No cervical adenopathy.     Upper Body:     Right upper body: No supraclavicular, axillary or pectoral adenopathy.     Left upper body: No supraclavicular, axillary or pectoral adenopathy.  Skin:    General: Skin is warm and dry.  Neurological:     General: No focal deficit present.     Mental Status: She is alert and oriented to person, place, and time. Mental status is at baseline.  Psychiatric:        Mood and Affect: Mood normal.        Behavior: Behavior normal.        Thought Content: Thought content normal.        Judgment: Judgment normal.    LABS:      Latest Ref Rng & Units 07/19/2007    7:47 AM 06/20/2007    2:25 PM 12/21/2006    2:50 PM  CBC  WBC 3.9 - 10.0 10e3/uL  3.8  4.8   Hemoglobin  14.3  12.4  12.7   Hematocrit  42.0  36.1  36.3   Platelets 145 - 400 10e3/uL  156  157       Latest Ref Rng & Units 07/19/2007    7:47 AM 06/20/2007    2:25 PM 12/21/2006    2:50 PM  CMP  Glucose  96  105  106   BUN 6 - 23 mg/dL  12  12   Creatinine 9.59 - 1.20 mg/dL  9.13  9.19   Sodium  857  140  143   Potassium  4.0  4.3  3.9   Chloride 96 - 112 mEq/L  103  107   CO2 19 - 32 mEq/L  28  27   Calcium 8.4 - 10.5 mg/dL  9.2  9.7   Total Protein 6.0 - 8.3 g/dL  6.9  7.1   Total Bilirubin 0.3 - 1.2 mg/dL  0.4  0.6   Alkaline Phos 39 - 117 U/L  80  73   AST 0 - 37 U/L  22  25   ALT 0 - 35 U/L  19  23      No results found for: CEA1, CEA / No results found for: CEA1, CEA No results found for: PSA1 No results found for: CAN199 No results found for: CAN125  No results found for: TOTALPROTELP, ALBUMINELP, A1GS, A2GS, BETS, BETA2SER, GAMS, MSPIKE, SPEI No results found for: TIBC, FERRITIN, IRONPCTSAT Lab Results  Component Value Date   LDH 149 06/20/2007   LDH 159  10/04/2006   LDH 177 02/20/2006     STUDIES:  EXAM: 02/15/2024 DG  DEXA FINDINGS: Lumbar spine: L1, L2, L4, T-score -1.7 Left femoral neck: T-score -1.9 Left total hip: T-score -1.2 (improved 2%) Right femoral neck: T-score -2.1 Right total hip: T-score -1.8 (improved 2%)   EXAM: 02/15/2024 MAM DIGITAL W/TOMO IMPLANT B IMPRESSION: OVERALL FINAL ASSESSMENT: BIRADS 2: BENIGN  Exam: 12/27/2022 Digital Diagnostic Bilateral Mammogram with Tomosynthesis and CAD Impression: No evidence of breast malignancy  EXAM: 10/20/21 DUAL X-RAY ABSORPTIOMETRY (DXA) FOR BONE MINERAL DENSITY  IMPRESSION:  Heidi Barnes completed a BMD test on 10/20/2021 using the Lunar iDXA  DXA System (analysis version: 13.60) manufactured by [employer].  The following summarizes the results of our evaluation.  TECHNOLOGIST: DAVED  PATIENT BIOGRAPHICAL:  Name: Heidi Barnes, Heidi Barnes  Patient ID: [id number] [facility] Birth Date: 06/04/1950 Height: 65.0 in.  Gender: Female Exam Date: 10/20/2021 Weight: 167.0 lbs.  Indications:  M85.89 History of Fracture (Adult) Fractures: RIGHT FOOT Treatments:  ASSESSMENT:  The BMD measured at Femur Neck Right is 0.736 g/cm2 with a T-score  of -2.2. This patient is considered osteopenic according to World  Health Organization [facility]) criteria.  The scan quality is good. Exclusions: L3 was excluded due to  degenerative changes.  Patient does not meet criteria for FRAX assessment- Patient  currently taking Evista .  Site Region Measured Measured WHO Young Adult BMD  Date Age Classification T-score  AP Spine L1-L4 (L3) 10/20/2021 72.7 Osteopenia -1.9 0.946 g/cm2  AP Spine L1-L4 (L3) 06/17/2018 69.3 Osteoporosis -2.6 0.859 g/cm2  AP Spine L1-L4 (L3) 10/28/2014 65.7 Osteoporosis -2.7 0.851 g/cm2  DualFemur Neck Right 10/20/2021 72.7 Osteopenia -2.2 0.736 g/cm2  DualFemur Neck Right 06/17/2018 69.3 Osteopenia -2.4 0.705 g/cm2  DualFemur Neck Right 10/28/2014 65.7 Osteoporosis -2.6 0.670 g/cm2  DualFemur Total Mean 10/20/2021 72.7 Osteopenia -1.7 0.800 g/cm2  DualFemur  Total Mean 06/17/2018 69.3 Osteopenia -1.8 0.777 g/cm2  DualFemur Total Mean 10/28/2014 65.7 Osteopenia -1.8 0.776 g/cm2  World Health Organization [facility]) criteria for post-menopausal Caucasian Women:  Normal: T-score at or above -1 SD  Osteopenia: T-score between -1 and -2.5 SD  Osteoporosis: T-score at or below -2.5 SD   RECOMMENDATIONS:  1. All patients should optimize calcium and vitamin D intake.  2. Consider FDA- approved medical therapies in post menopausal women  and men aged 54 years and older, based on the following:  a. A hip or vertebral (clinical or morphometric) fracture.  b. T- score < 2.5 of the femoral neck or spine after appropriate  evaluation to exclude secondary causes.  c. Low bone mass (T- score between -1.0 and -2.5 at femoral neck or  spine) and a 10 -year probability of a hip  fracture > 3% or a 10 -year probability of a major osteoporosis-  related fracture > 20% based on the [place] - adapted  WHO algorithm.  d. Clinician judgement and/ or patient preferences may indicate  treatment for people with 10- year fracture probabilities above or  below these levels.  FOLLOW-UP:  People with diagnosed cases of osteoporosis or at high risk for  fracture should have regular bone mineral density tests. For  patients eligible for Medicare, routine testing is allowed once  every 2 years. The testing frequency can be increased to one year  for patients who have rapidly progressing disease, those who are  receiving medical therapy to restore bone mass, or have additional  risk factors.   I have reviewed this report and agree with the above findings.  Electronically Signed  By: Lael Hines M.D.  On: 10/20/2021 11:46    Allergies: No Known Allergies  Current Medications: Current Outpatient Medications  Medication Sig Dispense Refill   buPROPion (WELLBUTRIN XL) 150 MG 24 hr tablet Take 150 mg by mouth daily.     lisinopril (ZESTRIL) 5 MG tablet Take 5 mg by mouth daily.  (Patient taking differently: Take 10 mg by mouth daily.)     Cholecalciferol (VITAMIN D3) 50 MCG (2000 UT) CAPS Take by mouth.     Guaifenesin  1200 MG TB12 Take 1 tablet (1,200 mg total) by mouth in the morning and at bedtime. 14 tablet 0   promethazine -dextromethorphan (PROMETHAZINE -DM) 6.25-15 MG/5ML syrup Take 5 mLs by mouth at bedtime as needed for cough. 118 mL 0   raloxifene  (EVISTA ) 60 MG tablet TAKE ONE TABLET BY MOUTH ONCE DAILY 90 tablet 3   rosuvastatin (CRESTOR) 10 MG tablet Take 10 mg by mouth 2 (two) times a week.     SYNTHROID 75 MCG tablet Take by mouth. 0.5 tablet Mon & Fri and whole tablet the rest of the week     vitamin C (ASCORBIC ACID) 500 MG tablet Take 500 mg by mouth daily.     zinc gluconate 50 MG tablet Take 50 mg by mouth daily.     No current facility-administered medications for this visit.   LILLETTE Aretta Cook, acting as a scribe for Heidi VEAR Cornish, MD, have documented all relevant documentation on the behalf of Heidi VEAR Cornish, MD, as directed by Heidi VEAR Cornish, MD, while in the presence of Heidi VEAR Cornish, MD.  I have reviewed this report as typed by the medical scribe, and it is complete and accurate.   "

## 2024-05-29 ENCOUNTER — Encounter: Payer: Self-pay | Admitting: Oncology

## 2025-05-26 ENCOUNTER — Inpatient Hospital Stay: Admitting: Oncology
